# Patient Record
Sex: Female | Born: 1960
Health system: Southern US, Community
[De-identification: ages and names within clinical notes are randomized; demographics above are authoritative.]

## PROBLEM LIST (undated history)

## (undated) DIAGNOSIS — J45909 Unspecified asthma, uncomplicated: Secondary | ICD-10-CM

## (undated) DIAGNOSIS — I10 Essential (primary) hypertension: Secondary | ICD-10-CM

## (undated) DIAGNOSIS — R011 Cardiac murmur, unspecified: Secondary | ICD-10-CM

## (undated) DIAGNOSIS — M1990 Unspecified osteoarthritis, unspecified site: Secondary | ICD-10-CM

## (undated) DIAGNOSIS — R519 Headache, unspecified: Secondary | ICD-10-CM

## (undated) DIAGNOSIS — K219 Gastro-esophageal reflux disease without esophagitis: Secondary | ICD-10-CM

## (undated) DIAGNOSIS — J392 Other diseases of pharynx: Secondary | ICD-10-CM

## (undated) DIAGNOSIS — R200 Anesthesia of skin: Secondary | ICD-10-CM

## (undated) DIAGNOSIS — I671 Cerebral aneurysm, nonruptured: Secondary | ICD-10-CM

## (undated) DIAGNOSIS — Z885 Allergy status to narcotic agent status: Secondary | ICD-10-CM

## (undated) HISTORY — DX: Essential (primary) hypertension: I10

## (undated) HISTORY — PX: REPLACEMENT TOTAL KNEE BILATERAL: SUR1225

## (undated) HISTORY — DX: Cerebral aneurysm, nonruptured: I67.1

## (undated) HISTORY — PX: COLONOSCOPY: SHX174

## (undated) HISTORY — PX: APPENDECTOMY: SHX54

## (undated) HISTORY — PX: CHOLECYSTECTOMY: SHX55

## (undated) HISTORY — PX: JOINT REPLACEMENT: SHX530

---

## 1998-06-09 ENCOUNTER — Other Ambulatory Visit: Admission: RE | Admit: 1998-06-09 | Discharge: 1998-06-09 | Payer: Self-pay | Admitting: Gynecology

## 1998-07-03 ENCOUNTER — Observation Stay (HOSPITAL_COMMUNITY): Admission: RE | Admit: 1998-07-03 | Discharge: 1998-07-04 | Payer: Self-pay | Admitting: Gynecology

## 2005-06-25 ENCOUNTER — Other Ambulatory Visit: Admission: RE | Admit: 2005-06-25 | Discharge: 2005-06-25 | Payer: Self-pay | Admitting: Obstetrics and Gynecology

## 2005-10-18 ENCOUNTER — Ambulatory Visit (HOSPITAL_BASED_OUTPATIENT_CLINIC_OR_DEPARTMENT_OTHER): Admission: RE | Admit: 2005-10-18 | Discharge: 2005-10-18 | Payer: Self-pay | Admitting: Unknown Physician Specialty

## 2005-11-17 ENCOUNTER — Other Ambulatory Visit: Admission: RE | Admit: 2005-11-17 | Discharge: 2005-11-17 | Payer: Self-pay | Admitting: Obstetrics and Gynecology

## 2006-03-23 ENCOUNTER — Other Ambulatory Visit: Admission: RE | Admit: 2006-03-23 | Discharge: 2006-03-23 | Payer: Self-pay | Admitting: Internal Medicine

## 2006-07-20 ENCOUNTER — Other Ambulatory Visit: Admission: RE | Admit: 2006-07-20 | Discharge: 2006-07-20 | Payer: Self-pay | Admitting: Obstetrics and Gynecology

## 2006-11-30 ENCOUNTER — Other Ambulatory Visit: Admission: RE | Admit: 2006-11-30 | Discharge: 2006-11-30 | Payer: Self-pay | Admitting: Obstetrics and Gynecology

## 2010-10-30 NOTE — Op Note (Signed)
Christina Carter, Christina Carter                ACCOUNT NO.:  [id number]   MEDICAL RECORD NO.:  [id number]          PATIENT TYPE:  AMB   LOCATION:  [facility]                         FACILITY:  [facility]   PHYSICIAN:  Cynthia P. Romine, M.D.DATE OF BIRTH:  07-Nov-1960   DATE OF PROCEDURE:  10/18/2005  DATE OF DISCHARGE:                                 OPERATIVE REPORT   PREOPERATIVE DIAGNOSIS:  Menorrhagia.   POSTOPERATIVE DIAGNOSIS:  Menorrhagia.   PROCEDURE:  Endometrial ablation using the hydrothermablation technique.   SURGEON:  Cynthia P. Romine, M.D.   ANESTHESIA:  General by LMA.   ESTIMATED BLOOD LOSS:  Minimal.   COMPLICATIONS:  None.   PROCEDURE:  The patient was taken to the operating room and after induction  of adequate general anesthesia by LMA was placed in the dorsal lithotomy  position and prepped and draped in the usual fashion.  The bladder was  drained with the red rubber catheter.  The posterior weighted and anterior  Sims retractor were placed and the cervix was grasped at the anterior lip  with a single-toothed tenaculum.  The uterus sounded to 7 cm in the  midposition.  Cervix was dilated to a #25 Shawnie Pons.  The blading hysteroscope  was then introduced and the endometrial cavity visualized.  The tubal ostia  were clearly seen.  The scope was withdrawn to just inside the internal os  and hydrothermablation was carried out in the usual fashion.  There were no  complications.  Following completion of the ablation, photographic  documentation was taken of the cavity.  The scope was withdrawn and the  procedure was terminated.  The instruments were removed from the vagina.  The patient was taken to the recovery room in satisfactory condition.           ______________________________  Edwena Felty. Romine, M.D.     CPR/MEDQ  D:  10/18/2005  T:  10/19/2005  Job:  [id number]

## 2017-04-20 DIAGNOSIS — H524 Presbyopia: Secondary | ICD-10-CM | POA: Diagnosis not present

## 2017-05-12 ENCOUNTER — Emergency Department
Admission: EM | Admit: 2017-05-12 | Discharge: 2017-05-12 | Disposition: A | Discharge status: Other Acute Inpatient Hospital | Payer: Medicare HMO | Attending: Emergency Medicine | Admitting: Emergency Medicine

## 2017-05-12 ENCOUNTER — Encounter: Payer: Self-pay | Admitting: Emergency Medicine

## 2017-05-12 ENCOUNTER — Emergency Department: Payer: Medicare HMO

## 2017-05-12 ENCOUNTER — Other Ambulatory Visit: Payer: Self-pay

## 2017-05-12 DIAGNOSIS — I6203 Nontraumatic chronic subdural hemorrhage: Secondary | ICD-10-CM | POA: Diagnosis not present

## 2017-05-12 DIAGNOSIS — S065XAA Traumatic subdural hemorrhage with loss of consciousness status unknown, initial encounter: Secondary | ICD-10-CM

## 2017-05-12 DIAGNOSIS — G935 Compression of brain: Secondary | ICD-10-CM | POA: Diagnosis not present

## 2017-05-12 DIAGNOSIS — I6201 Nontraumatic acute subdural hemorrhage: Secondary | ICD-10-CM | POA: Insufficient documentation

## 2017-05-12 DIAGNOSIS — Y33XXXA Other specified events, undetermined intent, initial encounter: Secondary | ICD-10-CM | POA: Diagnosis not present

## 2017-05-12 DIAGNOSIS — I6200 Nontraumatic subdural hemorrhage, unspecified: Secondary | ICD-10-CM | POA: Diagnosis not present

## 2017-05-12 DIAGNOSIS — R51 Headache: Secondary | ICD-10-CM | POA: Diagnosis present

## 2017-05-12 DIAGNOSIS — R402363 Coma scale, best motor response, obeys commands, at hospital admission: Secondary | ICD-10-CM | POA: Diagnosis not present

## 2017-05-12 DIAGNOSIS — I10 Essential (primary) hypertension: Secondary | ICD-10-CM | POA: Diagnosis not present

## 2017-05-12 DIAGNOSIS — S065X9A Traumatic subdural hemorrhage with loss of consciousness of unspecified duration, initial encounter: Secondary | ICD-10-CM | POA: Diagnosis not present

## 2017-05-12 DIAGNOSIS — K219 Gastro-esophageal reflux disease without esophagitis: Secondary | ICD-10-CM | POA: Diagnosis not present

## 2017-05-12 DIAGNOSIS — R402253 Coma scale, best verbal response, oriented, at hospital admission: Secondary | ICD-10-CM | POA: Diagnosis not present

## 2017-05-12 DIAGNOSIS — J45909 Unspecified asthma, uncomplicated: Secondary | ICD-10-CM | POA: Diagnosis not present

## 2017-05-12 DIAGNOSIS — R402143 Coma scale, eyes open, spontaneous, at hospital admission: Secondary | ICD-10-CM | POA: Diagnosis not present

## 2017-05-12 HISTORY — DX: Unspecified osteoarthritis, unspecified site: M19.90

## 2017-05-12 LAB — COMPREHENSIVE METABOLIC PANEL
ALBUMIN: 3.4 g/dL — AB (ref 3.5–5.0)
ALT: 44 U/L (ref 14–54)
ANION GAP: 13 (ref 5–15)
AST: 55 U/L — AB (ref 15–41)
Alkaline Phosphatase: 62 U/L (ref 38–126)
BUN: 6 mg/dL (ref 6–20)
CHLORIDE: 95 mmol/L — AB (ref 101–111)
CO2: 27 mmol/L (ref 22–32)
Calcium: 8.9 mg/dL (ref 8.9–10.3)
Creatinine, Ser: 0.54 mg/dL (ref 0.44–1.00)
GFR calc Af Amer: >60 mL/min (ref >60)
GFR calc non Af Amer: >60 mL/min (ref >60)
GLUCOSE: 110 mg/dL — AB (ref 65–99)
POTASSIUM: 3.1 mmol/L — AB (ref 3.5–5.1)
Sodium: 135 mmol/L (ref 135–145)
Total Bilirubin: 1 mg/dL (ref 0.3–1.2)
Total Protein: 7.4 g/dL (ref 6.5–8.1)

## 2017-05-12 LAB — CBC
HCT: 43.5 % (ref 35.0–47.0)
Hemoglobin: 15.1 g/dL (ref 12.0–16.0)
MCH: 35.8 pg — ABNORMAL HIGH (ref 26.0–34.0)
MCHC: 34.8 g/dL (ref 32.0–36.0)
MCV: 102.9 fL — ABNORMAL HIGH (ref 80.0–100.0)
PLATELETS: 286 10*3/uL (ref 150–440)
RBC: 4.23 MIL/uL (ref 3.80–5.20)
RDW: 13.2 % (ref 11.5–14.5)
WBC: 9.2 10*3/uL (ref 3.6–11.0)

## 2017-05-12 LAB — PROTIME-INR
INR: 0.9
PROTHROMBIN TIME: 12.1 s (ref 11.4–15.2)

## 2017-05-12 MED ORDER — KETOROLAC TROMETHAMINE 30 MG/ML IJ SOLN
30.0000 mg | Freq: Once | INTRAMUSCULAR | Status: AC
  Administered 2017-05-12: 30 mg via INTRAVENOUS
  Filled 2017-05-12: qty 1

## 2017-05-12 NOTE — ED Notes (Signed)
Epifanio Lesches at [facility] notified that pt was on the way with estimated eta 20-25 minutes.

## 2017-05-12 NOTE — ED Notes (Signed)
Husband took all pt belongings plus cane.

## 2017-05-12 NOTE — ED Triage Notes (Signed)
Patient ambulatory to triage with aid of cane, without difficulty or distress noted; pt reports right sided HA several days accomp by nausea; denies hx of same

## 2017-05-12 NOTE — ED Provider Notes (Signed)
[facility] Emergency Department Provider Note    First MD Initiated Contact with Patient 05/12/17 [phone]     (approximate)  I have reviewed the triage vital signs and the nursing notes.   HISTORY  Chief Complaint Headache    HPI Christina Carter is a 56 y.o. female density emergency department 4-day history of right occipital headache radiating to the frontal region with current pain score of 9 out of 10.  Patient also admits to altered vision when she looks from side to side.  Patient denies any weakness no numbness gait instability.  Patient denies any nausea or vomiting.  Patient states that she thought it was secondary to poor vision and as such she saw her eye doctor who informed her that her vision not the problem.  Patient denies any personal family history of aneurysms.   Past Medical History:  Diagnosis Date  . Osteoarthritis     There are no active problems to display for this patient.   History reviewed. No pertinent surgical history.  Prior to Admission medications   Not on File    Allergies Codeine  No family history on file.  Social History Social History   Tobacco Use  . Smoking status: Not on file  Substance Use Topics  . Alcohol use: Not on file  . Drug use: Not on file    Review of Systems Constitutional: No fever/chills Eyes: No visual changes. ENT: No sore throat. Cardiovascular: Denies chest pain. Respiratory: Denies shortness of breath. Gastrointestinal: No abdominal pain.  No nausea, no vomiting.  No diarrhea.  No constipation. Genitourinary: Negative for dysuria. Musculoskeletal: Negative for neck pain.  Negative for back pain. Integumentary: Negative for rash. Neurological: Positive for headaches, negative focal weakness or numbness.  ____________________________________________   PHYSICAL EXAM:  VITAL SIGNS: ED Triage Vitals  Enc Vitals Group     BP 05/12/17 0612 (!) 149/73     Pulse Rate 05/12/17  0612 92     Resp 05/12/17 0612 20     Temp 05/12/17 0612 97.9 F (36.6 C)     Temp Source 05/12/17 0612 Oral     SpO2 05/12/17 0612 96 %     Weight 05/12/17 0606 115.7 kg (255 lb)     Height 05/12/17 0606 1.626 m (5\' 4" )     Head Circumference --      Peak Flow --      Pain Score 05/12/17 0605 9     Pain Loc --      Pain Edu? --      Excl. in [place]? --     Constitutional: Alert and oriented. Well appearing and in no acute distress. Eyes: Conjunctivae are normal. PERRL. EOMI. Head: Atraumatic. Mouth/Throat: Mucous membranes are moist.  Oropharynx non-erythematous. Neck: No stridor.  No meningeal signs. Cardiovascular: Normal rate, regular rhythm. Good peripheral circulation. Grossly normal heart sounds. Respiratory: Normal respiratory effort.  No retractions. Lungs CTAB. Gastrointestinal: Soft and nontender. No distention.  Musculoskeletal: No lower extremity tenderness nor edema. No gross deformities of extremities. Neurologic:  Normal speech and language. No gross focal neurologic deficits are appreciated.  Skin:  Skin is warm, dry and intact. No rash noted. Psychiatric: Mood and affect are normal. Speech and behavior are normal.  ____________________________________________   LABS (all labs ordered are listed, but only abnormal results are displayed)  Labs Reviewed  CBC - Abnormal; Notable for the following components:      Result Value   MCV 102.9 (*)  MCH 35.8 (*)    All other components within normal limits  COMPREHENSIVE METABOLIC PANEL - Abnormal; Notable for the following components:   Potassium 3.1 (*)    Chloride 95 (*)    Glucose, Bld 110 (*)    Albumin 3.4 (*)    AST 55 (*)    All other components within normal limits  PROTIME-INR   ____________________________________________  EKG  ED ECG REPORT I, [place] N Naftali Carchi, the attending physician, personally viewed and interpreted this ECG.   Date: 05/12/2017  EKG Time: 6:49 AM  Rate: 80  Rhythm: Normal  sinus rhythm  Axis: Normal  Intervals: Normal  ST&T Change: None  ____________________________________________  RADIOLOGY I, [place] N Keiffer Piper, personally viewed and evaluated these images (plain radiographs) as part of my medical decision making, as well as reviewing the written report by the radiologist.  Ct Head Wo Contrast  Result Date: 05/12/2017 CLINICAL DATA:  Acute right-sided headache.  Nausea. EXAM: CT HEAD WITHOUT CONTRAST TECHNIQUE: Contiguous axial images were obtained from the base of the skull through the vertex without intravenous contrast. COMPARISON:  None. FINDINGS: Brain: Large subdural fluid collection measures up to 2 cm about the frontal convexity. There both acute and subacute/chronic components. Acute blood is seen anterior and posteriorly. Minimal blood tracks along the tentorium, and falx anteriorly. There is 12 mm right to left midline shift. Diminished size of the posterior horn of the right lateral ventricle. No hydrocephalus, the basilar cisterns are patent. No evidence of acute ischemia. Vascular: No hyperdense vessel or unexpected calcification. Skull: No fracture or focal lesion. Sinuses/Orbits: Paranasal sinuses and mastoid air cells are clear. The visualized orbits are unremarkable. Other: None. IMPRESSION: Large right subdural fluid collection with both acute and subacute components. There is 12 mm right to left midline shift. Critical Value/emergent results were called by telephone at the time of interpretation on 05/12/2017 at 6:44 am to Dr. Marjean Donna , who verbally acknowledged these results. Electronically Signed   By: Jeb Levering M.D.   On: 05/12/2017 06:45    ____________________________________________   PROCEDURES  Critical Care performed: CRITICAL CARE Performed by: Gregor Hams   Total critical care time: 40 minutes  Critical care time was exclusive of separately billable procedures and treating other patients.  Critical care  was necessary to treat or prevent imminent or life-threatening deterioration.  Critical care was time spent personally by me on the following activities: development of treatment plan with patient and/or surrogate as well as nursing, discussions with consultants, evaluation of patient's response to treatment, examination of patient, obtaining history from patient or surrogate, ordering and performing treatments and interventions, ordering and review of laboratory studies, ordering and review of radiographic studies, pulse oximetry and re-evaluation of patient's condition.  Procedures   ____________________________________________   INITIAL IMPRESSION / ASSESSMENT AND PLAN / ED COURSE  As part of my medical decision making, I reviewed the following data within the electronic MEDICAL RECORD [id number] year old female presented with above-stated history of.  Given concern for possible intracranial abnormality CT scan of the head was performed which revealed a large right frontal parietal subdural with approximate 12 mm of midline shift.  Patient discussed with Dr. Izora Ribas neurosurgeon on call who accepted the patient in transfer to [facility].  ____________________________________________  FINAL CLINICAL IMPRESSION(S) / ED DIAGNOSES  Final diagnoses:  Subdural hematoma ([place])     MEDICATIONS GIVEN DURING THIS VISIT:  Medications  ketorolac (TORADOL) 30 MG/ML injection 30 mg (30 mg Intravenous Given  05/12/17 1610)     ED Discharge Orders    None       Note:  This document was prepared using Dragon voice recognition software and may include unintentional dictation errors.    Gregor Hams, MD 05/12/17 [phone]

## 2017-05-14 DIAGNOSIS — I671 Cerebral aneurysm, nonruptured: Secondary | ICD-10-CM

## 2017-05-14 HISTORY — DX: Cerebral aneurysm, nonruptured: I67.1

## 2017-05-15 DIAGNOSIS — Z9181 History of falling: Secondary | ICD-10-CM | POA: Diagnosis not present

## 2017-05-15 DIAGNOSIS — Z6841 Body Mass Index (BMI) 40.0 and over, adult: Secondary | ICD-10-CM | POA: Diagnosis not present

## 2017-05-15 DIAGNOSIS — S065X9D Traumatic subdural hemorrhage with loss of consciousness of unspecified duration, subsequent encounter: Secondary | ICD-10-CM | POA: Diagnosis not present

## 2017-05-15 DIAGNOSIS — J45909 Unspecified asthma, uncomplicated: Secondary | ICD-10-CM | POA: Diagnosis not present

## 2017-05-15 DIAGNOSIS — I10 Essential (primary) hypertension: Secondary | ICD-10-CM | POA: Diagnosis not present

## 2017-05-26 DIAGNOSIS — Z4802 Encounter for removal of sutures: Secondary | ICD-10-CM | POA: Diagnosis not present

## 2017-06-17 DIAGNOSIS — J45909 Unspecified asthma, uncomplicated: Secondary | ICD-10-CM | POA: Diagnosis not present

## 2017-06-17 DIAGNOSIS — Z1331 Encounter for screening for depression: Secondary | ICD-10-CM | POA: Diagnosis not present

## 2017-06-17 DIAGNOSIS — Z6841 Body Mass Index (BMI) 40.0 and over, adult: Secondary | ICD-10-CM | POA: Diagnosis not present

## 2017-06-17 DIAGNOSIS — I10 Essential (primary) hypertension: Secondary | ICD-10-CM | POA: Diagnosis not present

## 2017-06-17 DIAGNOSIS — E7800 Pure hypercholesterolemia, unspecified: Secondary | ICD-10-CM | POA: Diagnosis not present

## 2018-03-16 DIAGNOSIS — I10 Essential (primary) hypertension: Secondary | ICD-10-CM | POA: Diagnosis not present

## 2018-03-16 DIAGNOSIS — E7800 Pure hypercholesterolemia, unspecified: Secondary | ICD-10-CM | POA: Diagnosis not present

## 2018-05-03 DIAGNOSIS — Z6841 Body Mass Index (BMI) 40.0 and over, adult: Secondary | ICD-10-CM | POA: Diagnosis not present

## 2018-05-03 DIAGNOSIS — Z1339 Encounter for screening examination for other mental health and behavioral disorders: Secondary | ICD-10-CM | POA: Diagnosis not present

## 2018-05-03 DIAGNOSIS — E7800 Pure hypercholesterolemia, unspecified: Secondary | ICD-10-CM | POA: Diagnosis not present

## 2018-05-03 DIAGNOSIS — I10 Essential (primary) hypertension: Secondary | ICD-10-CM | POA: Diagnosis not present

## 2018-05-03 DIAGNOSIS — M170 Bilateral primary osteoarthritis of knee: Secondary | ICD-10-CM | POA: Diagnosis not present

## 2018-05-03 DIAGNOSIS — J45909 Unspecified asthma, uncomplicated: Secondary | ICD-10-CM | POA: Diagnosis not present

## 2018-05-03 DIAGNOSIS — G4700 Insomnia, unspecified: Secondary | ICD-10-CM | POA: Diagnosis not present

## 2018-07-26 DIAGNOSIS — B001 Herpesviral vesicular dermatitis: Secondary | ICD-10-CM | POA: Diagnosis not present

## 2018-07-26 DIAGNOSIS — Z6841 Body Mass Index (BMI) 40.0 and over, adult: Secondary | ICD-10-CM | POA: Diagnosis not present

## 2018-07-26 DIAGNOSIS — K120 Recurrent oral aphthae: Secondary | ICD-10-CM | POA: Diagnosis not present

## 2018-11-02 DIAGNOSIS — J45909 Unspecified asthma, uncomplicated: Secondary | ICD-10-CM | POA: Diagnosis not present

## 2018-11-02 DIAGNOSIS — I10 Essential (primary) hypertension: Secondary | ICD-10-CM | POA: Diagnosis not present

## 2018-11-02 DIAGNOSIS — R5383 Other fatigue: Secondary | ICD-10-CM | POA: Diagnosis not present

## 2018-11-02 DIAGNOSIS — E7800 Pure hypercholesterolemia, unspecified: Secondary | ICD-10-CM | POA: Diagnosis not present

## 2018-11-02 DIAGNOSIS — M170 Bilateral primary osteoarthritis of knee: Secondary | ICD-10-CM | POA: Diagnosis not present

## 2018-11-02 DIAGNOSIS — K219 Gastro-esophageal reflux disease without esophagitis: Secondary | ICD-10-CM | POA: Diagnosis not present

## 2018-12-08 ENCOUNTER — Ambulatory Visit
Admission: RE | Admit: 2018-12-08 | Discharge: 2018-12-08 | Disposition: A | Discharge status: Home / Self Care | Payer: Disability Insurance | Source: Ambulatory Visit | Attending: Dentistry | Admitting: Dentistry

## 2018-12-08 ENCOUNTER — Other Ambulatory Visit: Payer: Self-pay | Admitting: Dentistry

## 2018-12-08 DIAGNOSIS — M1712 Unilateral primary osteoarthritis, left knee: Secondary | ICD-10-CM

## 2018-12-08 DIAGNOSIS — M1711 Unilateral primary osteoarthritis, right knee: Secondary | ICD-10-CM | POA: Diagnosis present

## 2019-01-02 DIAGNOSIS — I10 Essential (primary) hypertension: Secondary | ICD-10-CM | POA: Diagnosis not present

## 2019-01-02 DIAGNOSIS — E7800 Pure hypercholesterolemia, unspecified: Secondary | ICD-10-CM | POA: Diagnosis not present

## 2019-01-20 IMAGING — CT CT HEAD W/O CM
3 series · 15 of 46 positions shown, 18 images · non-contrast
Comparison: None.

CLINICAL DATA: Acute right-sided headache.  Nausea.

EXAM:
CT HEAD WITHOUT CONTRAST
TECHNIQUE: Contiguous axial images were obtained from the base of the skull
through the vertex without intravenous contrast.

[Series 3: head wo · axial · 0.40mm/px · z∈[-115,+5]mm · 9 of 29 slices shown, 12 images]
[im 3/29  brain]
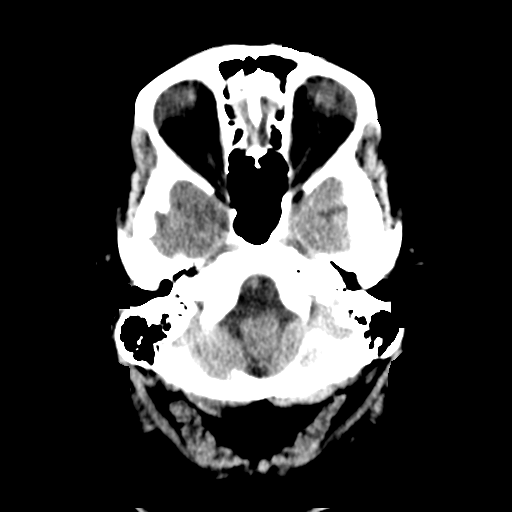
[im 3/29  bone]
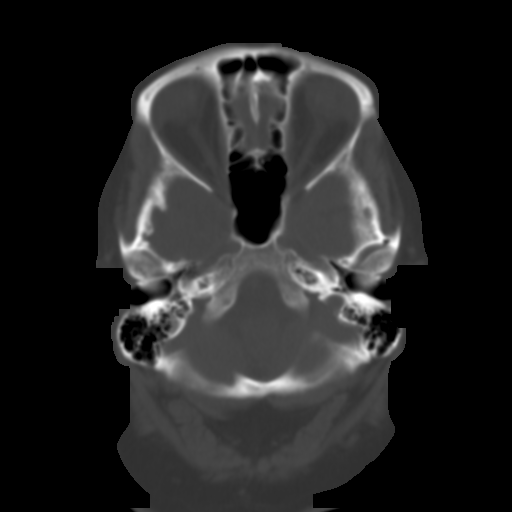
[im 6/29  brain]
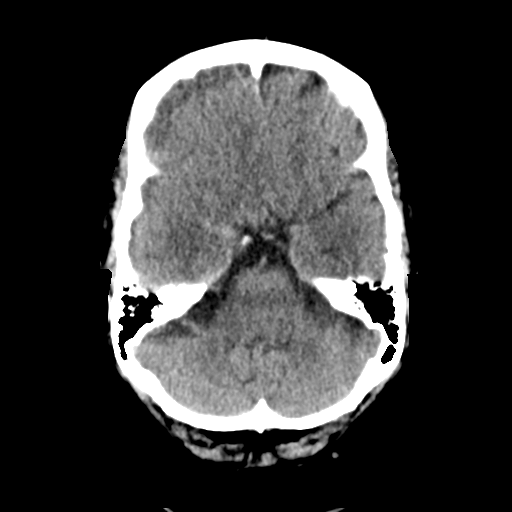
[im 9/29  brain]
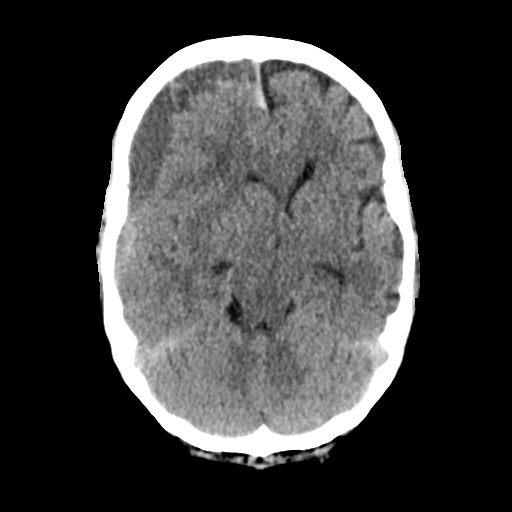
[im 12/29  brain]
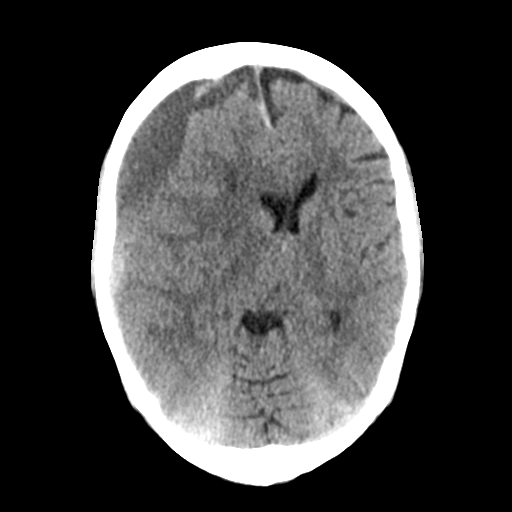
[im 15/29  brain]
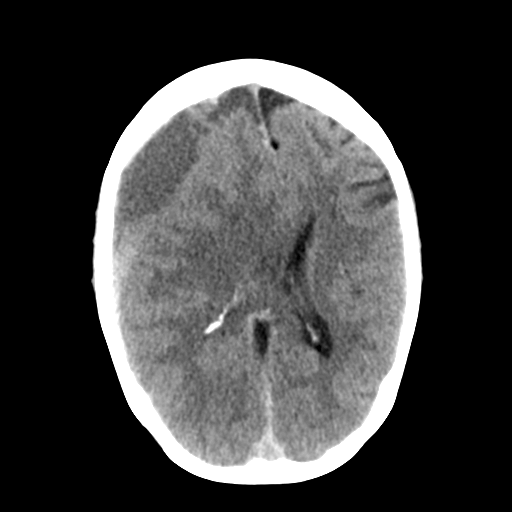
[im 15/29  bone]
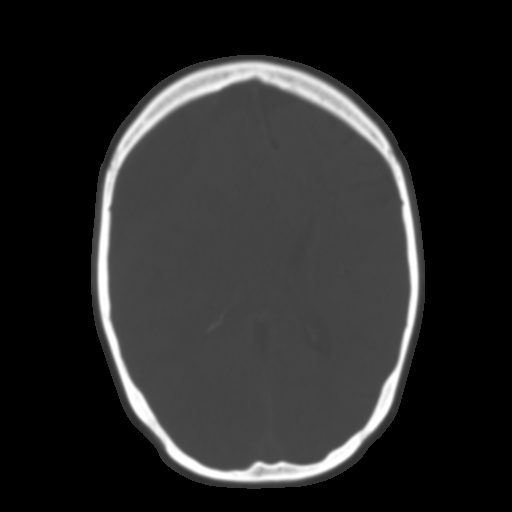
[im 18/29  brain]
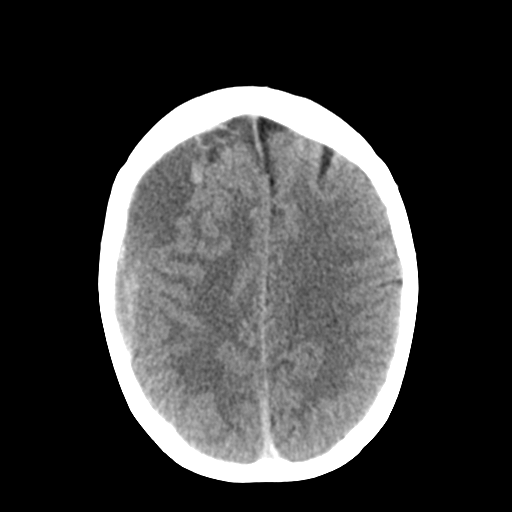
[im 21/29  brain]
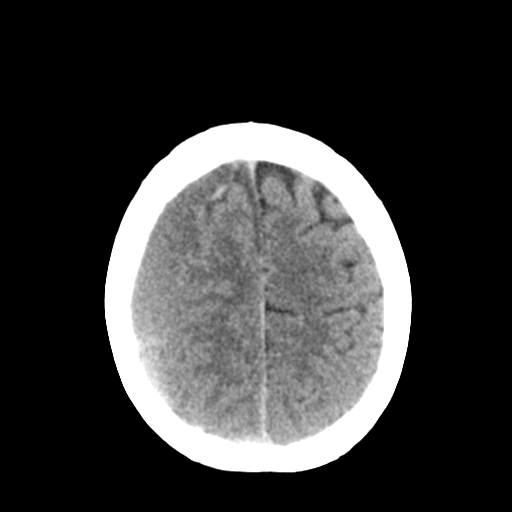
[im 24/29  brain]
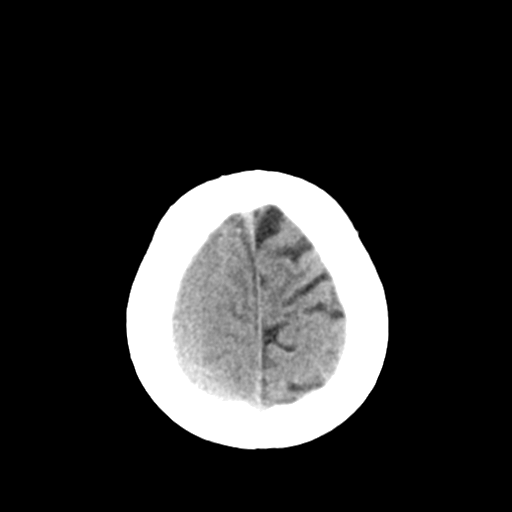
[im 27/29  brain]
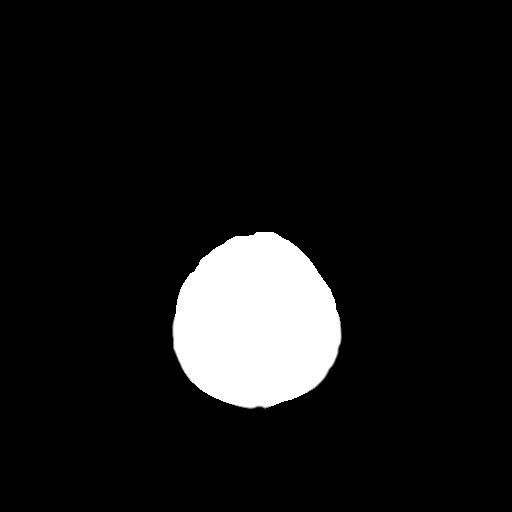
[im 27/29  bone]
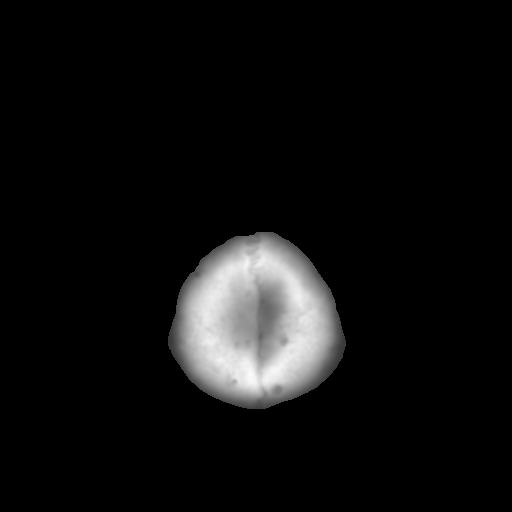

[Series 4: coronal soft tissue · coronal · 0.29mm/px · 3 of 62 slices shown]
[im 21/62  brain]
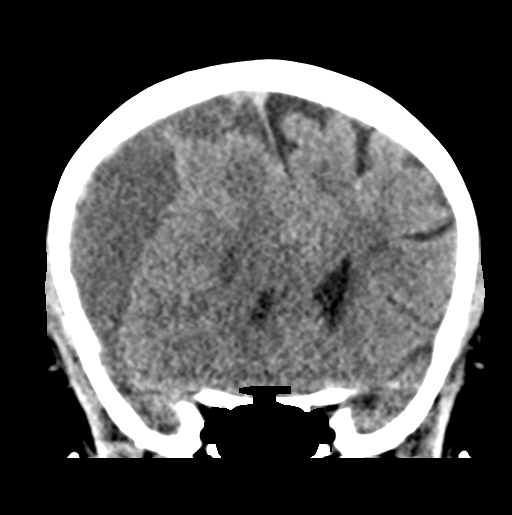
[im 28/62  brain]
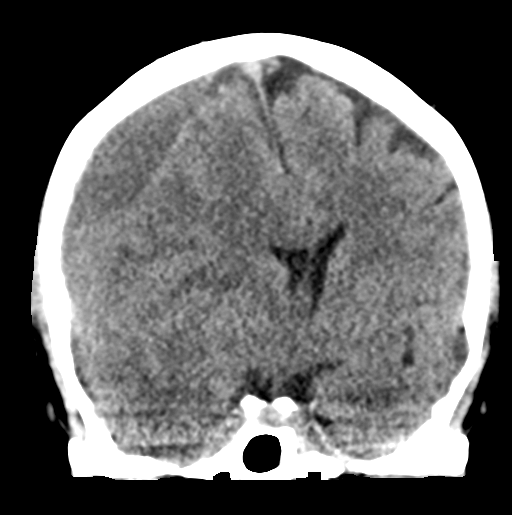
[im 34/62  brain]
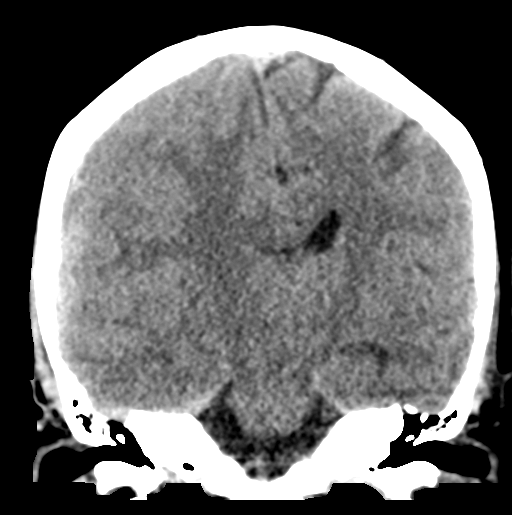

[Series 5: sagittal soft tissue · sagittal · 0.29mm/px · 3 of 47 slices shown]
[im 16/47  brain]
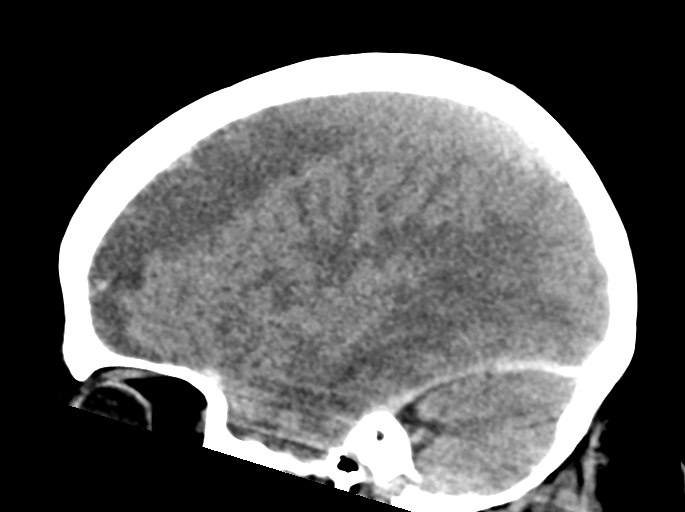
[im 24/47  brain]
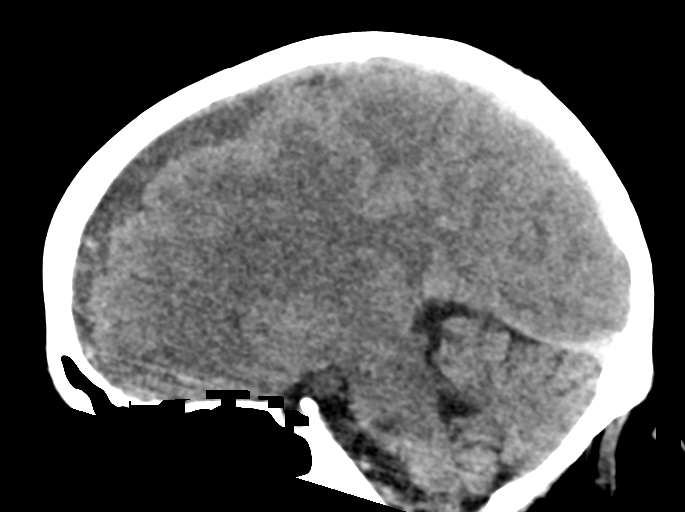
[im 31/47  brain]
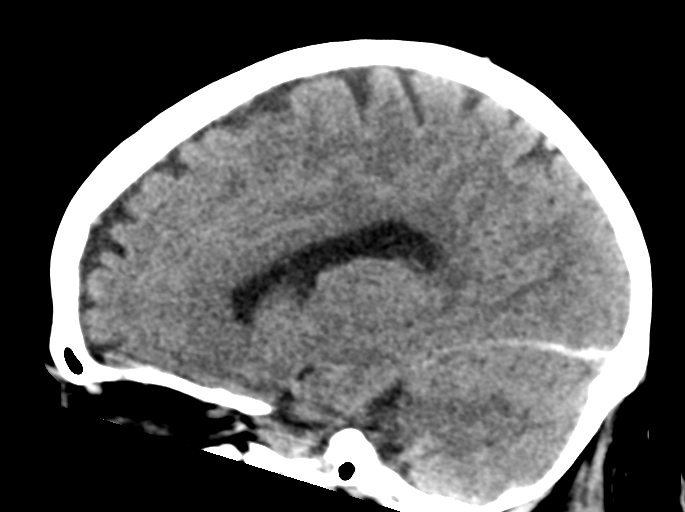

[15 of 46 positions shown; findings below may reference images not displayed]

FINDINGS: Brain: Large subdural fluid collection measures up to 2 cm about the
frontal convexity. There both acute and subacute/chronic components.
Acute blood is seen anterior and posteriorly. Minimal blood tracks
along the tentorium, and falx anteriorly. There is 12 mm right to
left midline shift. Diminished size of the posterior horn of the
right lateral ventricle. No hydrocephalus, the basilar cisterns are
patent. No evidence of acute ischemia.

Vascular: No hyperdense vessel or unexpected calcification.

Skull: No fracture or focal lesion.

Sinuses/Orbits: Paranasal sinuses and mastoid air cells are clear.
The visualized orbits are unremarkable.

Other: None.
IMPRESSION: Large right subdural fluid collection with both acute and subacute
components. There is 12 mm right to left midline shift.

Critical Value/emergent results were called by telephone at the time
of interpretation on 05/12/2017 at [DATE] to Dr. REYMUNDO DARIUS ,
who verbally acknowledged these results.

## 2019-05-08 DIAGNOSIS — E7800 Pure hypercholesterolemia, unspecified: Secondary | ICD-10-CM | POA: Diagnosis not present

## 2019-05-08 DIAGNOSIS — Z79899 Other long term (current) drug therapy: Secondary | ICD-10-CM | POA: Diagnosis not present

## 2019-05-08 DIAGNOSIS — M170 Bilateral primary osteoarthritis of knee: Secondary | ICD-10-CM | POA: Diagnosis not present

## 2019-05-08 DIAGNOSIS — Z1331 Encounter for screening for depression: Secondary | ICD-10-CM | POA: Diagnosis not present

## 2019-05-08 DIAGNOSIS — J45909 Unspecified asthma, uncomplicated: Secondary | ICD-10-CM | POA: Diagnosis not present

## 2019-05-08 DIAGNOSIS — I10 Essential (primary) hypertension: Secondary | ICD-10-CM | POA: Diagnosis not present

## 2019-05-08 DIAGNOSIS — Z6841 Body Mass Index (BMI) 40.0 and over, adult: Secondary | ICD-10-CM | POA: Diagnosis not present

## 2019-05-17 DIAGNOSIS — M170 Bilateral primary osteoarthritis of knee: Secondary | ICD-10-CM | POA: Diagnosis not present

## 2019-05-24 DIAGNOSIS — M1711 Unilateral primary osteoarthritis, right knee: Secondary | ICD-10-CM | POA: Diagnosis not present

## 2019-05-30 DIAGNOSIS — I10 Essential (primary) hypertension: Secondary | ICD-10-CM | POA: Diagnosis not present

## 2019-05-30 DIAGNOSIS — M1711 Unilateral primary osteoarthritis, right knee: Secondary | ICD-10-CM | POA: Diagnosis not present

## 2019-05-30 DIAGNOSIS — Z9181 History of falling: Secondary | ICD-10-CM | POA: Diagnosis not present

## 2019-05-30 DIAGNOSIS — J45909 Unspecified asthma, uncomplicated: Secondary | ICD-10-CM | POA: Diagnosis not present

## 2019-05-30 DIAGNOSIS — Z87891 Personal history of nicotine dependence: Secondary | ICD-10-CM | POA: Diagnosis not present

## 2019-05-30 DIAGNOSIS — Z96652 Presence of left artificial knee joint: Secondary | ICD-10-CM | POA: Diagnosis not present

## 2019-05-30 DIAGNOSIS — K219 Gastro-esophageal reflux disease without esophagitis: Secondary | ICD-10-CM | POA: Diagnosis not present

## 2019-06-02 DIAGNOSIS — Z87891 Personal history of nicotine dependence: Secondary | ICD-10-CM | POA: Diagnosis not present

## 2019-06-02 DIAGNOSIS — K219 Gastro-esophageal reflux disease without esophagitis: Secondary | ICD-10-CM | POA: Diagnosis not present

## 2019-06-02 DIAGNOSIS — Z96652 Presence of left artificial knee joint: Secondary | ICD-10-CM | POA: Diagnosis not present

## 2019-06-02 DIAGNOSIS — I10 Essential (primary) hypertension: Secondary | ICD-10-CM | POA: Diagnosis not present

## 2019-06-02 DIAGNOSIS — M1711 Unilateral primary osteoarthritis, right knee: Secondary | ICD-10-CM | POA: Diagnosis not present

## 2019-06-02 DIAGNOSIS — J45909 Unspecified asthma, uncomplicated: Secondary | ICD-10-CM | POA: Diagnosis not present

## 2019-06-02 DIAGNOSIS — Z9181 History of falling: Secondary | ICD-10-CM | POA: Diagnosis not present

## 2019-06-04 DIAGNOSIS — J45909 Unspecified asthma, uncomplicated: Secondary | ICD-10-CM | POA: Diagnosis not present

## 2019-06-04 DIAGNOSIS — M1711 Unilateral primary osteoarthritis, right knee: Secondary | ICD-10-CM | POA: Diagnosis not present

## 2019-06-04 DIAGNOSIS — Z9181 History of falling: Secondary | ICD-10-CM | POA: Diagnosis not present

## 2019-06-04 DIAGNOSIS — K219 Gastro-esophageal reflux disease without esophagitis: Secondary | ICD-10-CM | POA: Diagnosis not present

## 2019-06-04 DIAGNOSIS — Z96652 Presence of left artificial knee joint: Secondary | ICD-10-CM | POA: Diagnosis not present

## 2019-06-04 DIAGNOSIS — Z87891 Personal history of nicotine dependence: Secondary | ICD-10-CM | POA: Diagnosis not present

## 2019-06-04 DIAGNOSIS — I10 Essential (primary) hypertension: Secondary | ICD-10-CM | POA: Diagnosis not present

## 2019-06-09 DIAGNOSIS — K219 Gastro-esophageal reflux disease without esophagitis: Secondary | ICD-10-CM | POA: Diagnosis not present

## 2019-06-09 DIAGNOSIS — J45909 Unspecified asthma, uncomplicated: Secondary | ICD-10-CM | POA: Diagnosis not present

## 2019-06-09 DIAGNOSIS — I10 Essential (primary) hypertension: Secondary | ICD-10-CM | POA: Diagnosis not present

## 2019-06-09 DIAGNOSIS — Z9181 History of falling: Secondary | ICD-10-CM | POA: Diagnosis not present

## 2019-06-09 DIAGNOSIS — M1711 Unilateral primary osteoarthritis, right knee: Secondary | ICD-10-CM | POA: Diagnosis not present

## 2019-06-09 DIAGNOSIS — Z87891 Personal history of nicotine dependence: Secondary | ICD-10-CM | POA: Diagnosis not present

## 2019-06-09 DIAGNOSIS — Z96652 Presence of left artificial knee joint: Secondary | ICD-10-CM | POA: Diagnosis not present

## 2019-06-12 DIAGNOSIS — Z96652 Presence of left artificial knee joint: Secondary | ICD-10-CM | POA: Diagnosis not present

## 2019-06-12 DIAGNOSIS — I10 Essential (primary) hypertension: Secondary | ICD-10-CM | POA: Diagnosis not present

## 2019-06-12 DIAGNOSIS — Z9181 History of falling: Secondary | ICD-10-CM | POA: Diagnosis not present

## 2019-06-12 DIAGNOSIS — K219 Gastro-esophageal reflux disease without esophagitis: Secondary | ICD-10-CM | POA: Diagnosis not present

## 2019-06-12 DIAGNOSIS — M1711 Unilateral primary osteoarthritis, right knee: Secondary | ICD-10-CM | POA: Diagnosis not present

## 2019-06-12 DIAGNOSIS — Z87891 Personal history of nicotine dependence: Secondary | ICD-10-CM | POA: Diagnosis not present

## 2019-06-12 DIAGNOSIS — J45909 Unspecified asthma, uncomplicated: Secondary | ICD-10-CM | POA: Diagnosis not present

## 2019-06-14 DIAGNOSIS — Z87891 Personal history of nicotine dependence: Secondary | ICD-10-CM | POA: Diagnosis not present

## 2019-06-14 DIAGNOSIS — M1711 Unilateral primary osteoarthritis, right knee: Secondary | ICD-10-CM | POA: Diagnosis not present

## 2019-06-14 DIAGNOSIS — K219 Gastro-esophageal reflux disease without esophagitis: Secondary | ICD-10-CM | POA: Diagnosis not present

## 2019-06-14 DIAGNOSIS — Z9181 History of falling: Secondary | ICD-10-CM | POA: Diagnosis not present

## 2019-06-14 DIAGNOSIS — J45909 Unspecified asthma, uncomplicated: Secondary | ICD-10-CM | POA: Diagnosis not present

## 2019-06-14 DIAGNOSIS — I10 Essential (primary) hypertension: Secondary | ICD-10-CM | POA: Diagnosis not present

## 2019-06-14 DIAGNOSIS — Z96652 Presence of left artificial knee joint: Secondary | ICD-10-CM | POA: Diagnosis not present

## 2019-06-18 DIAGNOSIS — Z87891 Personal history of nicotine dependence: Secondary | ICD-10-CM | POA: Diagnosis not present

## 2019-06-18 DIAGNOSIS — Z9181 History of falling: Secondary | ICD-10-CM | POA: Diagnosis not present

## 2019-06-18 DIAGNOSIS — I10 Essential (primary) hypertension: Secondary | ICD-10-CM | POA: Diagnosis not present

## 2019-06-18 DIAGNOSIS — K219 Gastro-esophageal reflux disease without esophagitis: Secondary | ICD-10-CM | POA: Diagnosis not present

## 2019-06-18 DIAGNOSIS — Z96652 Presence of left artificial knee joint: Secondary | ICD-10-CM | POA: Diagnosis not present

## 2019-06-18 DIAGNOSIS — M1711 Unilateral primary osteoarthritis, right knee: Secondary | ICD-10-CM | POA: Diagnosis not present

## 2019-06-18 DIAGNOSIS — J45909 Unspecified asthma, uncomplicated: Secondary | ICD-10-CM | POA: Diagnosis not present

## 2019-06-20 DIAGNOSIS — J45909 Unspecified asthma, uncomplicated: Secondary | ICD-10-CM | POA: Diagnosis not present

## 2019-06-20 DIAGNOSIS — Z87891 Personal history of nicotine dependence: Secondary | ICD-10-CM | POA: Diagnosis not present

## 2019-06-20 DIAGNOSIS — Z01818 Encounter for other preprocedural examination: Secondary | ICD-10-CM | POA: Diagnosis not present

## 2019-06-20 DIAGNOSIS — K219 Gastro-esophageal reflux disease without esophagitis: Secondary | ICD-10-CM | POA: Diagnosis not present

## 2019-06-20 DIAGNOSIS — Z96652 Presence of left artificial knee joint: Secondary | ICD-10-CM | POA: Diagnosis not present

## 2019-06-20 DIAGNOSIS — M170 Bilateral primary osteoarthritis of knee: Secondary | ICD-10-CM | POA: Diagnosis not present

## 2019-06-20 DIAGNOSIS — Z6841 Body Mass Index (BMI) 40.0 and over, adult: Secondary | ICD-10-CM | POA: Diagnosis not present

## 2019-06-20 DIAGNOSIS — Z9181 History of falling: Secondary | ICD-10-CM | POA: Diagnosis not present

## 2019-06-20 DIAGNOSIS — I10 Essential (primary) hypertension: Secondary | ICD-10-CM | POA: Diagnosis not present

## 2019-06-20 DIAGNOSIS — M1711 Unilateral primary osteoarthritis, right knee: Secondary | ICD-10-CM | POA: Diagnosis not present

## 2019-06-26 DIAGNOSIS — I10 Essential (primary) hypertension: Secondary | ICD-10-CM | POA: Diagnosis not present

## 2019-06-26 DIAGNOSIS — K219 Gastro-esophageal reflux disease without esophagitis: Secondary | ICD-10-CM | POA: Diagnosis not present

## 2019-06-26 DIAGNOSIS — Z96652 Presence of left artificial knee joint: Secondary | ICD-10-CM | POA: Diagnosis not present

## 2019-06-26 DIAGNOSIS — Z87891 Personal history of nicotine dependence: Secondary | ICD-10-CM | POA: Diagnosis not present

## 2019-06-26 DIAGNOSIS — M1711 Unilateral primary osteoarthritis, right knee: Secondary | ICD-10-CM | POA: Diagnosis not present

## 2019-06-26 DIAGNOSIS — J45909 Unspecified asthma, uncomplicated: Secondary | ICD-10-CM | POA: Diagnosis not present

## 2019-06-26 DIAGNOSIS — Z9181 History of falling: Secondary | ICD-10-CM | POA: Diagnosis not present

## 2019-06-27 DIAGNOSIS — M6281 Muscle weakness (generalized): Secondary | ICD-10-CM | POA: Diagnosis not present

## 2019-06-28 DIAGNOSIS — Z96652 Presence of left artificial knee joint: Secondary | ICD-10-CM | POA: Diagnosis not present

## 2019-06-28 DIAGNOSIS — J45909 Unspecified asthma, uncomplicated: Secondary | ICD-10-CM | POA: Diagnosis not present

## 2019-06-28 DIAGNOSIS — Z87891 Personal history of nicotine dependence: Secondary | ICD-10-CM | POA: Diagnosis not present

## 2019-06-28 DIAGNOSIS — M1711 Unilateral primary osteoarthritis, right knee: Secondary | ICD-10-CM | POA: Diagnosis not present

## 2019-06-28 DIAGNOSIS — Z9181 History of falling: Secondary | ICD-10-CM | POA: Diagnosis not present

## 2019-06-28 DIAGNOSIS — K219 Gastro-esophageal reflux disease without esophagitis: Secondary | ICD-10-CM | POA: Diagnosis not present

## 2019-06-28 DIAGNOSIS — I10 Essential (primary) hypertension: Secondary | ICD-10-CM | POA: Diagnosis not present

## 2019-07-03 DIAGNOSIS — M1711 Unilateral primary osteoarthritis, right knee: Secondary | ICD-10-CM | POA: Diagnosis not present

## 2019-07-03 DIAGNOSIS — K219 Gastro-esophageal reflux disease without esophagitis: Secondary | ICD-10-CM | POA: Diagnosis not present

## 2019-07-03 DIAGNOSIS — Z87891 Personal history of nicotine dependence: Secondary | ICD-10-CM | POA: Diagnosis not present

## 2019-07-03 DIAGNOSIS — M6281 Muscle weakness (generalized): Secondary | ICD-10-CM | POA: Diagnosis not present

## 2019-07-03 DIAGNOSIS — M25561 Pain in right knee: Secondary | ICD-10-CM | POA: Diagnosis not present

## 2019-07-03 DIAGNOSIS — I10 Essential (primary) hypertension: Secondary | ICD-10-CM | POA: Diagnosis not present

## 2019-07-03 DIAGNOSIS — M25661 Stiffness of right knee, not elsewhere classified: Secondary | ICD-10-CM | POA: Diagnosis not present

## 2019-07-03 DIAGNOSIS — Z96652 Presence of left artificial knee joint: Secondary | ICD-10-CM | POA: Diagnosis not present

## 2019-07-03 DIAGNOSIS — Z9181 History of falling: Secondary | ICD-10-CM | POA: Diagnosis not present

## 2019-07-03 DIAGNOSIS — J45909 Unspecified asthma, uncomplicated: Secondary | ICD-10-CM | POA: Diagnosis not present

## 2019-07-05 DIAGNOSIS — K219 Gastro-esophageal reflux disease without esophagitis: Secondary | ICD-10-CM | POA: Diagnosis not present

## 2019-07-05 DIAGNOSIS — I10 Essential (primary) hypertension: Secondary | ICD-10-CM | POA: Diagnosis not present

## 2019-07-05 DIAGNOSIS — J45909 Unspecified asthma, uncomplicated: Secondary | ICD-10-CM | POA: Diagnosis not present

## 2019-07-05 DIAGNOSIS — Z9181 History of falling: Secondary | ICD-10-CM | POA: Diagnosis not present

## 2019-07-05 DIAGNOSIS — Z96652 Presence of left artificial knee joint: Secondary | ICD-10-CM | POA: Diagnosis not present

## 2019-07-05 DIAGNOSIS — M1711 Unilateral primary osteoarthritis, right knee: Secondary | ICD-10-CM | POA: Diagnosis not present

## 2019-07-05 DIAGNOSIS — Z87891 Personal history of nicotine dependence: Secondary | ICD-10-CM | POA: Diagnosis not present

## 2019-07-06 ENCOUNTER — Other Ambulatory Visit: Payer: Disability Insurance

## 2019-07-10 DIAGNOSIS — Z96652 Presence of left artificial knee joint: Secondary | ICD-10-CM | POA: Diagnosis not present

## 2019-07-10 DIAGNOSIS — Z87891 Personal history of nicotine dependence: Secondary | ICD-10-CM | POA: Diagnosis not present

## 2019-07-10 DIAGNOSIS — J45909 Unspecified asthma, uncomplicated: Secondary | ICD-10-CM | POA: Diagnosis not present

## 2019-07-10 DIAGNOSIS — K219 Gastro-esophageal reflux disease without esophagitis: Secondary | ICD-10-CM | POA: Diagnosis not present

## 2019-07-10 DIAGNOSIS — M1711 Unilateral primary osteoarthritis, right knee: Secondary | ICD-10-CM | POA: Diagnosis not present

## 2019-07-10 DIAGNOSIS — Z9181 History of falling: Secondary | ICD-10-CM | POA: Diagnosis not present

## 2019-07-10 DIAGNOSIS — I10 Essential (primary) hypertension: Secondary | ICD-10-CM | POA: Diagnosis not present

## 2019-07-13 DIAGNOSIS — K219 Gastro-esophageal reflux disease without esophagitis: Secondary | ICD-10-CM | POA: Diagnosis not present

## 2019-07-13 DIAGNOSIS — Z9181 History of falling: Secondary | ICD-10-CM | POA: Diagnosis not present

## 2019-07-13 DIAGNOSIS — Z96652 Presence of left artificial knee joint: Secondary | ICD-10-CM | POA: Diagnosis not present

## 2019-07-13 DIAGNOSIS — I10 Essential (primary) hypertension: Secondary | ICD-10-CM | POA: Diagnosis not present

## 2019-07-13 DIAGNOSIS — J45909 Unspecified asthma, uncomplicated: Secondary | ICD-10-CM | POA: Diagnosis not present

## 2019-07-13 DIAGNOSIS — M1711 Unilateral primary osteoarthritis, right knee: Secondary | ICD-10-CM | POA: Diagnosis not present

## 2019-07-13 DIAGNOSIS — Z87891 Personal history of nicotine dependence: Secondary | ICD-10-CM | POA: Diagnosis not present

## 2019-07-16 ENCOUNTER — Other Ambulatory Visit: Admission: RE | Admit: 2019-07-16 | Payer: Disability Insurance | Source: Ambulatory Visit

## 2019-07-17 DIAGNOSIS — Z01818 Encounter for other preprocedural examination: Secondary | ICD-10-CM | POA: Diagnosis not present

## 2019-07-17 DIAGNOSIS — Z20822 Contact with and (suspected) exposure to covid-19: Secondary | ICD-10-CM | POA: Diagnosis not present

## 2019-07-17 DIAGNOSIS — Z01812 Encounter for preprocedural laboratory examination: Secondary | ICD-10-CM | POA: Diagnosis not present

## 2019-07-17 DIAGNOSIS — M1711 Unilateral primary osteoarthritis, right knee: Secondary | ICD-10-CM | POA: Diagnosis not present

## 2019-07-18 ENCOUNTER — Ambulatory Visit: Admit: 2019-07-18 | Payer: Disability Insurance | Admitting: Orthopedic Surgery

## 2019-07-18 DIAGNOSIS — M1711 Unilateral primary osteoarthritis, right knee: Secondary | ICD-10-CM | POA: Diagnosis not present

## 2019-07-18 SURGERY — ARTHROPLASTY, KNEE, TOTAL
Anesthesia: Spinal | Site: Knee | Laterality: Right

## 2019-07-19 DIAGNOSIS — G8918 Other acute postprocedural pain: Secondary | ICD-10-CM | POA: Diagnosis not present

## 2019-07-19 DIAGNOSIS — J45909 Unspecified asthma, uncomplicated: Secondary | ICD-10-CM | POA: Diagnosis not present

## 2019-07-19 DIAGNOSIS — M1711 Unilateral primary osteoarthritis, right knee: Secondary | ICD-10-CM | POA: Diagnosis not present

## 2019-07-19 DIAGNOSIS — M25561 Pain in right knee: Secondary | ICD-10-CM | POA: Diagnosis not present

## 2019-07-19 DIAGNOSIS — K219 Gastro-esophageal reflux disease without esophagitis: Secondary | ICD-10-CM | POA: Diagnosis not present

## 2019-07-19 DIAGNOSIS — F329 Major depressive disorder, single episode, unspecified: Secondary | ICD-10-CM | POA: Diagnosis not present

## 2019-07-19 DIAGNOSIS — Z8679 Personal history of other diseases of the circulatory system: Secondary | ICD-10-CM | POA: Diagnosis not present

## 2019-07-19 DIAGNOSIS — M170 Bilateral primary osteoarthritis of knee: Secondary | ICD-10-CM | POA: Diagnosis not present

## 2019-07-19 DIAGNOSIS — I10 Essential (primary) hypertension: Secondary | ICD-10-CM | POA: Diagnosis not present

## 2019-07-19 DIAGNOSIS — F419 Anxiety disorder, unspecified: Secondary | ICD-10-CM | POA: Diagnosis not present

## 2019-07-19 DIAGNOSIS — Z885 Allergy status to narcotic agent status: Secondary | ICD-10-CM | POA: Diagnosis not present

## 2019-07-19 DIAGNOSIS — Z87891 Personal history of nicotine dependence: Secondary | ICD-10-CM | POA: Diagnosis not present

## 2019-07-20 DIAGNOSIS — Z87891 Personal history of nicotine dependence: Secondary | ICD-10-CM | POA: Diagnosis not present

## 2019-07-20 DIAGNOSIS — J45909 Unspecified asthma, uncomplicated: Secondary | ICD-10-CM | POA: Diagnosis not present

## 2019-07-20 DIAGNOSIS — F419 Anxiety disorder, unspecified: Secondary | ICD-10-CM | POA: Diagnosis not present

## 2019-07-20 DIAGNOSIS — M170 Bilateral primary osteoarthritis of knee: Secondary | ICD-10-CM | POA: Diagnosis not present

## 2019-07-20 DIAGNOSIS — Z8679 Personal history of other diseases of the circulatory system: Secondary | ICD-10-CM | POA: Diagnosis not present

## 2019-07-20 DIAGNOSIS — K219 Gastro-esophageal reflux disease without esophagitis: Secondary | ICD-10-CM | POA: Diagnosis not present

## 2019-07-20 DIAGNOSIS — F329 Major depressive disorder, single episode, unspecified: Secondary | ICD-10-CM | POA: Diagnosis not present

## 2019-07-20 DIAGNOSIS — I10 Essential (primary) hypertension: Secondary | ICD-10-CM | POA: Diagnosis not present

## 2019-07-20 DIAGNOSIS — Z885 Allergy status to narcotic agent status: Secondary | ICD-10-CM | POA: Diagnosis not present

## 2019-07-21 DIAGNOSIS — M1712 Unilateral primary osteoarthritis, left knee: Secondary | ICD-10-CM | POA: Diagnosis not present

## 2019-07-21 DIAGNOSIS — Z96651 Presence of right artificial knee joint: Secondary | ICD-10-CM | POA: Diagnosis not present

## 2019-07-21 DIAGNOSIS — Z471 Aftercare following joint replacement surgery: Secondary | ICD-10-CM | POA: Diagnosis not present

## 2019-07-21 DIAGNOSIS — I10 Essential (primary) hypertension: Secondary | ICD-10-CM | POA: Diagnosis not present

## 2019-07-21 DIAGNOSIS — Z7901 Long term (current) use of anticoagulants: Secondary | ICD-10-CM | POA: Diagnosis not present

## 2019-07-21 DIAGNOSIS — J45909 Unspecified asthma, uncomplicated: Secondary | ICD-10-CM | POA: Diagnosis not present

## 2019-07-21 DIAGNOSIS — Z87891 Personal history of nicotine dependence: Secondary | ICD-10-CM | POA: Diagnosis not present

## 2019-07-21 DIAGNOSIS — K219 Gastro-esophageal reflux disease without esophagitis: Secondary | ICD-10-CM | POA: Diagnosis not present

## 2019-07-23 DIAGNOSIS — Z471 Aftercare following joint replacement surgery: Secondary | ICD-10-CM | POA: Diagnosis not present

## 2019-07-23 DIAGNOSIS — Z96651 Presence of right artificial knee joint: Secondary | ICD-10-CM | POA: Diagnosis not present

## 2019-07-23 DIAGNOSIS — J45909 Unspecified asthma, uncomplicated: Secondary | ICD-10-CM | POA: Diagnosis not present

## 2019-07-23 DIAGNOSIS — M1712 Unilateral primary osteoarthritis, left knee: Secondary | ICD-10-CM | POA: Diagnosis not present

## 2019-07-23 DIAGNOSIS — Z7901 Long term (current) use of anticoagulants: Secondary | ICD-10-CM | POA: Diagnosis not present

## 2019-07-23 DIAGNOSIS — I10 Essential (primary) hypertension: Secondary | ICD-10-CM | POA: Diagnosis not present

## 2019-07-23 DIAGNOSIS — Z87891 Personal history of nicotine dependence: Secondary | ICD-10-CM | POA: Diagnosis not present

## 2019-07-23 DIAGNOSIS — K219 Gastro-esophageal reflux disease without esophagitis: Secondary | ICD-10-CM | POA: Diagnosis not present

## 2019-07-25 DIAGNOSIS — Z96651 Presence of right artificial knee joint: Secondary | ICD-10-CM | POA: Diagnosis not present

## 2019-07-25 DIAGNOSIS — Z7901 Long term (current) use of anticoagulants: Secondary | ICD-10-CM | POA: Diagnosis not present

## 2019-07-25 DIAGNOSIS — M1712 Unilateral primary osteoarthritis, left knee: Secondary | ICD-10-CM | POA: Diagnosis not present

## 2019-07-25 DIAGNOSIS — Z87891 Personal history of nicotine dependence: Secondary | ICD-10-CM | POA: Diagnosis not present

## 2019-07-25 DIAGNOSIS — K219 Gastro-esophageal reflux disease without esophagitis: Secondary | ICD-10-CM | POA: Diagnosis not present

## 2019-07-25 DIAGNOSIS — J45909 Unspecified asthma, uncomplicated: Secondary | ICD-10-CM | POA: Diagnosis not present

## 2019-07-25 DIAGNOSIS — I10 Essential (primary) hypertension: Secondary | ICD-10-CM | POA: Diagnosis not present

## 2019-07-25 DIAGNOSIS — Z471 Aftercare following joint replacement surgery: Secondary | ICD-10-CM | POA: Diagnosis not present

## 2019-07-26 DIAGNOSIS — M1712 Unilateral primary osteoarthritis, left knee: Secondary | ICD-10-CM | POA: Diagnosis not present

## 2019-07-26 DIAGNOSIS — K219 Gastro-esophageal reflux disease without esophagitis: Secondary | ICD-10-CM | POA: Diagnosis not present

## 2019-07-26 DIAGNOSIS — Z471 Aftercare following joint replacement surgery: Secondary | ICD-10-CM | POA: Diagnosis not present

## 2019-07-26 DIAGNOSIS — Z96651 Presence of right artificial knee joint: Secondary | ICD-10-CM | POA: Diagnosis not present

## 2019-07-26 DIAGNOSIS — J45909 Unspecified asthma, uncomplicated: Secondary | ICD-10-CM | POA: Diagnosis not present

## 2019-07-26 DIAGNOSIS — Z7901 Long term (current) use of anticoagulants: Secondary | ICD-10-CM | POA: Diagnosis not present

## 2019-07-26 DIAGNOSIS — Z87891 Personal history of nicotine dependence: Secondary | ICD-10-CM | POA: Diagnosis not present

## 2019-07-26 DIAGNOSIS — I10 Essential (primary) hypertension: Secondary | ICD-10-CM | POA: Diagnosis not present

## 2019-07-30 DIAGNOSIS — Z87891 Personal history of nicotine dependence: Secondary | ICD-10-CM | POA: Diagnosis not present

## 2019-07-30 DIAGNOSIS — Z7901 Long term (current) use of anticoagulants: Secondary | ICD-10-CM | POA: Diagnosis not present

## 2019-07-30 DIAGNOSIS — Z96651 Presence of right artificial knee joint: Secondary | ICD-10-CM | POA: Diagnosis not present

## 2019-07-30 DIAGNOSIS — I10 Essential (primary) hypertension: Secondary | ICD-10-CM | POA: Diagnosis not present

## 2019-07-30 DIAGNOSIS — J45909 Unspecified asthma, uncomplicated: Secondary | ICD-10-CM | POA: Diagnosis not present

## 2019-07-30 DIAGNOSIS — M1712 Unilateral primary osteoarthritis, left knee: Secondary | ICD-10-CM | POA: Diagnosis not present

## 2019-07-30 DIAGNOSIS — K219 Gastro-esophageal reflux disease without esophagitis: Secondary | ICD-10-CM | POA: Diagnosis not present

## 2019-07-30 DIAGNOSIS — Z471 Aftercare following joint replacement surgery: Secondary | ICD-10-CM | POA: Diagnosis not present

## 2019-08-01 DIAGNOSIS — J45909 Unspecified asthma, uncomplicated: Secondary | ICD-10-CM | POA: Diagnosis not present

## 2019-08-01 DIAGNOSIS — K219 Gastro-esophageal reflux disease without esophagitis: Secondary | ICD-10-CM | POA: Diagnosis not present

## 2019-08-01 DIAGNOSIS — Z96651 Presence of right artificial knee joint: Secondary | ICD-10-CM | POA: Diagnosis not present

## 2019-08-01 DIAGNOSIS — M1712 Unilateral primary osteoarthritis, left knee: Secondary | ICD-10-CM | POA: Diagnosis not present

## 2019-08-01 DIAGNOSIS — Z7901 Long term (current) use of anticoagulants: Secondary | ICD-10-CM | POA: Diagnosis not present

## 2019-08-01 DIAGNOSIS — I10 Essential (primary) hypertension: Secondary | ICD-10-CM | POA: Diagnosis not present

## 2019-08-01 DIAGNOSIS — Z87891 Personal history of nicotine dependence: Secondary | ICD-10-CM | POA: Diagnosis not present

## 2019-08-01 DIAGNOSIS — Z471 Aftercare following joint replacement surgery: Secondary | ICD-10-CM | POA: Diagnosis not present

## 2019-08-03 DIAGNOSIS — Z96651 Presence of right artificial knee joint: Secondary | ICD-10-CM | POA: Diagnosis not present

## 2019-08-03 DIAGNOSIS — Z7901 Long term (current) use of anticoagulants: Secondary | ICD-10-CM | POA: Diagnosis not present

## 2019-08-03 DIAGNOSIS — M1712 Unilateral primary osteoarthritis, left knee: Secondary | ICD-10-CM | POA: Diagnosis not present

## 2019-08-03 DIAGNOSIS — Z87891 Personal history of nicotine dependence: Secondary | ICD-10-CM | POA: Diagnosis not present

## 2019-08-03 DIAGNOSIS — Z471 Aftercare following joint replacement surgery: Secondary | ICD-10-CM | POA: Diagnosis not present

## 2019-08-03 DIAGNOSIS — J45909 Unspecified asthma, uncomplicated: Secondary | ICD-10-CM | POA: Diagnosis not present

## 2019-08-03 DIAGNOSIS — I10 Essential (primary) hypertension: Secondary | ICD-10-CM | POA: Diagnosis not present

## 2019-08-03 DIAGNOSIS — K219 Gastro-esophageal reflux disease without esophagitis: Secondary | ICD-10-CM | POA: Diagnosis not present

## 2019-08-07 DIAGNOSIS — K219 Gastro-esophageal reflux disease without esophagitis: Secondary | ICD-10-CM | POA: Diagnosis not present

## 2019-08-07 DIAGNOSIS — Z471 Aftercare following joint replacement surgery: Secondary | ICD-10-CM | POA: Diagnosis not present

## 2019-08-07 DIAGNOSIS — Z87891 Personal history of nicotine dependence: Secondary | ICD-10-CM | POA: Diagnosis not present

## 2019-08-07 DIAGNOSIS — M1712 Unilateral primary osteoarthritis, left knee: Secondary | ICD-10-CM | POA: Diagnosis not present

## 2019-08-07 DIAGNOSIS — Z7901 Long term (current) use of anticoagulants: Secondary | ICD-10-CM | POA: Diagnosis not present

## 2019-08-07 DIAGNOSIS — I10 Essential (primary) hypertension: Secondary | ICD-10-CM | POA: Diagnosis not present

## 2019-08-07 DIAGNOSIS — J45909 Unspecified asthma, uncomplicated: Secondary | ICD-10-CM | POA: Diagnosis not present

## 2019-08-07 DIAGNOSIS — Z96651 Presence of right artificial knee joint: Secondary | ICD-10-CM | POA: Diagnosis not present

## 2019-08-09 DIAGNOSIS — K219 Gastro-esophageal reflux disease without esophagitis: Secondary | ICD-10-CM | POA: Diagnosis not present

## 2019-08-09 DIAGNOSIS — Z471 Aftercare following joint replacement surgery: Secondary | ICD-10-CM | POA: Diagnosis not present

## 2019-08-09 DIAGNOSIS — Z7901 Long term (current) use of anticoagulants: Secondary | ICD-10-CM | POA: Diagnosis not present

## 2019-08-09 DIAGNOSIS — J45909 Unspecified asthma, uncomplicated: Secondary | ICD-10-CM | POA: Diagnosis not present

## 2019-08-09 DIAGNOSIS — Z96651 Presence of right artificial knee joint: Secondary | ICD-10-CM | POA: Diagnosis not present

## 2019-08-09 DIAGNOSIS — M1712 Unilateral primary osteoarthritis, left knee: Secondary | ICD-10-CM | POA: Diagnosis not present

## 2019-08-09 DIAGNOSIS — Z87891 Personal history of nicotine dependence: Secondary | ICD-10-CM | POA: Diagnosis not present

## 2019-08-09 DIAGNOSIS — I10 Essential (primary) hypertension: Secondary | ICD-10-CM | POA: Diagnosis not present

## 2019-08-13 DIAGNOSIS — I10 Essential (primary) hypertension: Secondary | ICD-10-CM | POA: Diagnosis not present

## 2019-08-13 DIAGNOSIS — K219 Gastro-esophageal reflux disease without esophagitis: Secondary | ICD-10-CM | POA: Diagnosis not present

## 2019-08-13 DIAGNOSIS — Z471 Aftercare following joint replacement surgery: Secondary | ICD-10-CM | POA: Diagnosis not present

## 2019-08-13 DIAGNOSIS — Z7901 Long term (current) use of anticoagulants: Secondary | ICD-10-CM | POA: Diagnosis not present

## 2019-08-13 DIAGNOSIS — Z87891 Personal history of nicotine dependence: Secondary | ICD-10-CM | POA: Diagnosis not present

## 2019-08-13 DIAGNOSIS — M1712 Unilateral primary osteoarthritis, left knee: Secondary | ICD-10-CM | POA: Diagnosis not present

## 2019-08-13 DIAGNOSIS — Z96651 Presence of right artificial knee joint: Secondary | ICD-10-CM | POA: Diagnosis not present

## 2019-08-13 DIAGNOSIS — J45909 Unspecified asthma, uncomplicated: Secondary | ICD-10-CM | POA: Diagnosis not present

## 2019-08-15 DIAGNOSIS — I10 Essential (primary) hypertension: Secondary | ICD-10-CM | POA: Diagnosis not present

## 2019-08-15 DIAGNOSIS — Z7901 Long term (current) use of anticoagulants: Secondary | ICD-10-CM | POA: Diagnosis not present

## 2019-08-15 DIAGNOSIS — J45909 Unspecified asthma, uncomplicated: Secondary | ICD-10-CM | POA: Diagnosis not present

## 2019-08-15 DIAGNOSIS — K219 Gastro-esophageal reflux disease without esophagitis: Secondary | ICD-10-CM | POA: Diagnosis not present

## 2019-08-15 DIAGNOSIS — M1712 Unilateral primary osteoarthritis, left knee: Secondary | ICD-10-CM | POA: Diagnosis not present

## 2019-08-15 DIAGNOSIS — Z96651 Presence of right artificial knee joint: Secondary | ICD-10-CM | POA: Diagnosis not present

## 2019-08-15 DIAGNOSIS — Z471 Aftercare following joint replacement surgery: Secondary | ICD-10-CM | POA: Diagnosis not present

## 2019-08-15 DIAGNOSIS — Z87891 Personal history of nicotine dependence: Secondary | ICD-10-CM | POA: Diagnosis not present

## 2019-08-22 DIAGNOSIS — M25561 Pain in right knee: Secondary | ICD-10-CM | POA: Diagnosis not present

## 2019-08-22 DIAGNOSIS — M25661 Stiffness of right knee, not elsewhere classified: Secondary | ICD-10-CM | POA: Diagnosis not present

## 2019-08-27 DIAGNOSIS — Z96651 Presence of right artificial knee joint: Secondary | ICD-10-CM | POA: Diagnosis not present

## 2019-09-05 DIAGNOSIS — Z96651 Presence of right artificial knee joint: Secondary | ICD-10-CM | POA: Diagnosis not present

## 2019-09-05 DIAGNOSIS — M25561 Pain in right knee: Secondary | ICD-10-CM | POA: Diagnosis not present

## 2019-10-08 DIAGNOSIS — Z96659 Presence of unspecified artificial knee joint: Secondary | ICD-10-CM | POA: Diagnosis not present

## 2019-10-08 DIAGNOSIS — M1712 Unilateral primary osteoarthritis, left knee: Secondary | ICD-10-CM | POA: Diagnosis not present

## 2019-10-08 DIAGNOSIS — M170 Bilateral primary osteoarthritis of knee: Secondary | ICD-10-CM | POA: Diagnosis not present

## 2019-11-08 DIAGNOSIS — R634 Abnormal weight loss: Secondary | ICD-10-CM | POA: Diagnosis not present

## 2019-11-08 DIAGNOSIS — M170 Bilateral primary osteoarthritis of knee: Secondary | ICD-10-CM | POA: Diagnosis not present

## 2019-11-08 DIAGNOSIS — K219 Gastro-esophageal reflux disease without esophagitis: Secondary | ICD-10-CM | POA: Diagnosis not present

## 2019-11-08 DIAGNOSIS — Z6832 Body mass index (BMI) 32.0-32.9, adult: Secondary | ICD-10-CM | POA: Diagnosis not present

## 2019-11-08 DIAGNOSIS — I10 Essential (primary) hypertension: Secondary | ICD-10-CM | POA: Diagnosis not present

## 2019-11-08 DIAGNOSIS — E7800 Pure hypercholesterolemia, unspecified: Secondary | ICD-10-CM | POA: Diagnosis not present

## 2019-12-31 DIAGNOSIS — M1712 Unilateral primary osteoarthritis, left knee: Secondary | ICD-10-CM | POA: Diagnosis not present

## 2020-04-21 DIAGNOSIS — M1712 Unilateral primary osteoarthritis, left knee: Secondary | ICD-10-CM | POA: Diagnosis not present

## 2020-04-21 DIAGNOSIS — Z96651 Presence of right artificial knee joint: Secondary | ICD-10-CM | POA: Diagnosis not present

## 2020-05-13 DIAGNOSIS — E7800 Pure hypercholesterolemia, unspecified: Secondary | ICD-10-CM | POA: Diagnosis not present

## 2020-05-13 DIAGNOSIS — Z01818 Encounter for other preprocedural examination: Secondary | ICD-10-CM | POA: Diagnosis not present

## 2020-05-13 DIAGNOSIS — J45909 Unspecified asthma, uncomplicated: Secondary | ICD-10-CM | POA: Diagnosis not present

## 2020-05-13 DIAGNOSIS — R634 Abnormal weight loss: Secondary | ICD-10-CM | POA: Diagnosis not present

## 2020-05-13 DIAGNOSIS — Z6826 Body mass index (BMI) 26.0-26.9, adult: Secondary | ICD-10-CM | POA: Diagnosis not present

## 2020-05-13 DIAGNOSIS — L918 Other hypertrophic disorders of the skin: Secondary | ICD-10-CM | POA: Diagnosis not present

## 2020-05-13 DIAGNOSIS — Z1331 Encounter for screening for depression: Secondary | ICD-10-CM | POA: Diagnosis not present

## 2020-05-13 DIAGNOSIS — I10 Essential (primary) hypertension: Secondary | ICD-10-CM | POA: Diagnosis not present

## 2020-05-13 DIAGNOSIS — M170 Bilateral primary osteoarthritis of knee: Secondary | ICD-10-CM | POA: Diagnosis not present

## 2020-05-20 DIAGNOSIS — M25562 Pain in left knee: Secondary | ICD-10-CM | POA: Diagnosis not present

## 2020-05-20 DIAGNOSIS — M25662 Stiffness of left knee, not elsewhere classified: Secondary | ICD-10-CM | POA: Diagnosis not present

## 2020-05-20 DIAGNOSIS — M1712 Unilateral primary osteoarthritis, left knee: Secondary | ICD-10-CM | POA: Diagnosis not present

## 2020-05-29 DIAGNOSIS — Z885 Allergy status to narcotic agent status: Secondary | ICD-10-CM | POA: Diagnosis not present

## 2020-05-29 DIAGNOSIS — I10 Essential (primary) hypertension: Secondary | ICD-10-CM | POA: Diagnosis not present

## 2020-05-29 DIAGNOSIS — G8918 Other acute postprocedural pain: Secondary | ICD-10-CM | POA: Diagnosis not present

## 2020-05-29 DIAGNOSIS — M25562 Pain in left knee: Secondary | ICD-10-CM | POA: Diagnosis not present

## 2020-05-29 DIAGNOSIS — K219 Gastro-esophageal reflux disease without esophagitis: Secondary | ICD-10-CM | POA: Diagnosis not present

## 2020-05-29 DIAGNOSIS — Z96652 Presence of left artificial knee joint: Secondary | ICD-10-CM | POA: Diagnosis not present

## 2020-05-29 DIAGNOSIS — M25762 Osteophyte, left knee: Secondary | ICD-10-CM | POA: Diagnosis not present

## 2020-05-29 DIAGNOSIS — M1712 Unilateral primary osteoarthritis, left knee: Secondary | ICD-10-CM | POA: Diagnosis not present

## 2020-05-29 DIAGNOSIS — J45909 Unspecified asthma, uncomplicated: Secondary | ICD-10-CM | POA: Diagnosis not present

## 2020-05-30 DIAGNOSIS — Z885 Allergy status to narcotic agent status: Secondary | ICD-10-CM | POA: Diagnosis not present

## 2020-05-30 DIAGNOSIS — M1712 Unilateral primary osteoarthritis, left knee: Secondary | ICD-10-CM | POA: Diagnosis not present

## 2020-05-30 DIAGNOSIS — I10 Essential (primary) hypertension: Secondary | ICD-10-CM | POA: Diagnosis not present

## 2020-05-30 DIAGNOSIS — J45909 Unspecified asthma, uncomplicated: Secondary | ICD-10-CM | POA: Diagnosis not present

## 2020-05-30 DIAGNOSIS — M25762 Osteophyte, left knee: Secondary | ICD-10-CM | POA: Diagnosis not present

## 2020-05-30 DIAGNOSIS — K219 Gastro-esophageal reflux disease without esophagitis: Secondary | ICD-10-CM | POA: Diagnosis not present

## 2020-06-10 ENCOUNTER — Other Ambulatory Visit: Payer: Self-pay | Admitting: Physician Assistant

## 2020-06-10 ENCOUNTER — Ambulatory Visit
Admission: RE | Admit: 2020-06-10 | Discharge: 2020-06-10 | Disposition: A | Discharge status: Home / Self Care | Payer: Medicare HMO | Source: Ambulatory Visit | Attending: Physician Assistant | Admitting: Physician Assistant

## 2020-06-10 ENCOUNTER — Other Ambulatory Visit: Payer: Self-pay

## 2020-06-10 DIAGNOSIS — R2242 Localized swelling, mass and lump, left lower limb: Secondary | ICD-10-CM

## 2020-06-10 DIAGNOSIS — R600 Localized edema: Secondary | ICD-10-CM | POA: Diagnosis not present

## 2020-06-10 DIAGNOSIS — M79605 Pain in left leg: Secondary | ICD-10-CM | POA: Diagnosis not present

## 2020-06-11 DIAGNOSIS — K219 Gastro-esophageal reflux disease without esophagitis: Secondary | ICD-10-CM | POA: Diagnosis not present

## 2020-06-11 DIAGNOSIS — F32A Depression, unspecified: Secondary | ICD-10-CM | POA: Diagnosis not present

## 2020-06-11 DIAGNOSIS — Z471 Aftercare following joint replacement surgery: Secondary | ICD-10-CM | POA: Diagnosis not present

## 2020-06-11 DIAGNOSIS — M1990 Unspecified osteoarthritis, unspecified site: Secondary | ICD-10-CM | POA: Diagnosis not present

## 2020-06-11 DIAGNOSIS — F419 Anxiety disorder, unspecified: Secondary | ICD-10-CM | POA: Diagnosis not present

## 2020-06-11 DIAGNOSIS — Z87891 Personal history of nicotine dependence: Secondary | ICD-10-CM | POA: Diagnosis not present

## 2020-06-11 DIAGNOSIS — J45909 Unspecified asthma, uncomplicated: Secondary | ICD-10-CM | POA: Diagnosis not present

## 2020-06-11 DIAGNOSIS — I10 Essential (primary) hypertension: Secondary | ICD-10-CM | POA: Diagnosis not present

## 2020-06-11 DIAGNOSIS — Z96653 Presence of artificial knee joint, bilateral: Secondary | ICD-10-CM | POA: Diagnosis not present

## 2020-06-17 DIAGNOSIS — I10 Essential (primary) hypertension: Secondary | ICD-10-CM | POA: Diagnosis not present

## 2020-06-17 DIAGNOSIS — J45909 Unspecified asthma, uncomplicated: Secondary | ICD-10-CM | POA: Diagnosis not present

## 2020-06-17 DIAGNOSIS — Z96653 Presence of artificial knee joint, bilateral: Secondary | ICD-10-CM | POA: Diagnosis not present

## 2020-06-17 DIAGNOSIS — Z87891 Personal history of nicotine dependence: Secondary | ICD-10-CM | POA: Diagnosis not present

## 2020-06-17 DIAGNOSIS — Z471 Aftercare following joint replacement surgery: Secondary | ICD-10-CM | POA: Diagnosis not present

## 2020-06-17 DIAGNOSIS — K219 Gastro-esophageal reflux disease without esophagitis: Secondary | ICD-10-CM | POA: Diagnosis not present

## 2020-06-17 DIAGNOSIS — F419 Anxiety disorder, unspecified: Secondary | ICD-10-CM | POA: Diagnosis not present

## 2020-06-17 DIAGNOSIS — F32A Depression, unspecified: Secondary | ICD-10-CM | POA: Diagnosis not present

## 2020-06-17 DIAGNOSIS — M1990 Unspecified osteoarthritis, unspecified site: Secondary | ICD-10-CM | POA: Diagnosis not present

## 2020-06-18 DIAGNOSIS — J45909 Unspecified asthma, uncomplicated: Secondary | ICD-10-CM | POA: Diagnosis not present

## 2020-06-18 DIAGNOSIS — K219 Gastro-esophageal reflux disease without esophagitis: Secondary | ICD-10-CM | POA: Diagnosis not present

## 2020-06-18 DIAGNOSIS — Z471 Aftercare following joint replacement surgery: Secondary | ICD-10-CM | POA: Diagnosis not present

## 2020-06-18 DIAGNOSIS — F419 Anxiety disorder, unspecified: Secondary | ICD-10-CM | POA: Diagnosis not present

## 2020-06-18 DIAGNOSIS — M1990 Unspecified osteoarthritis, unspecified site: Secondary | ICD-10-CM | POA: Diagnosis not present

## 2020-06-18 DIAGNOSIS — Z96653 Presence of artificial knee joint, bilateral: Secondary | ICD-10-CM | POA: Diagnosis not present

## 2020-06-18 DIAGNOSIS — F32A Depression, unspecified: Secondary | ICD-10-CM | POA: Diagnosis not present

## 2020-06-18 DIAGNOSIS — Z87891 Personal history of nicotine dependence: Secondary | ICD-10-CM | POA: Diagnosis not present

## 2020-06-18 DIAGNOSIS — I10 Essential (primary) hypertension: Secondary | ICD-10-CM | POA: Diagnosis not present

## 2020-06-23 DIAGNOSIS — Z471 Aftercare following joint replacement surgery: Secondary | ICD-10-CM | POA: Diagnosis not present

## 2020-06-23 DIAGNOSIS — Z87891 Personal history of nicotine dependence: Secondary | ICD-10-CM | POA: Diagnosis not present

## 2020-06-23 DIAGNOSIS — K219 Gastro-esophageal reflux disease without esophagitis: Secondary | ICD-10-CM | POA: Diagnosis not present

## 2020-06-23 DIAGNOSIS — F419 Anxiety disorder, unspecified: Secondary | ICD-10-CM | POA: Diagnosis not present

## 2020-06-23 DIAGNOSIS — M1990 Unspecified osteoarthritis, unspecified site: Secondary | ICD-10-CM | POA: Diagnosis not present

## 2020-06-23 DIAGNOSIS — J45909 Unspecified asthma, uncomplicated: Secondary | ICD-10-CM | POA: Diagnosis not present

## 2020-06-23 DIAGNOSIS — F32A Depression, unspecified: Secondary | ICD-10-CM | POA: Diagnosis not present

## 2020-06-23 DIAGNOSIS — Z96653 Presence of artificial knee joint, bilateral: Secondary | ICD-10-CM | POA: Diagnosis not present

## 2020-06-23 DIAGNOSIS — I10 Essential (primary) hypertension: Secondary | ICD-10-CM | POA: Diagnosis not present

## 2020-06-25 DIAGNOSIS — I10 Essential (primary) hypertension: Secondary | ICD-10-CM | POA: Diagnosis not present

## 2020-06-25 DIAGNOSIS — F419 Anxiety disorder, unspecified: Secondary | ICD-10-CM | POA: Diagnosis not present

## 2020-06-25 DIAGNOSIS — M1990 Unspecified osteoarthritis, unspecified site: Secondary | ICD-10-CM | POA: Diagnosis not present

## 2020-06-25 DIAGNOSIS — Z471 Aftercare following joint replacement surgery: Secondary | ICD-10-CM | POA: Diagnosis not present

## 2020-06-25 DIAGNOSIS — J45909 Unspecified asthma, uncomplicated: Secondary | ICD-10-CM | POA: Diagnosis not present

## 2020-06-25 DIAGNOSIS — Z87891 Personal history of nicotine dependence: Secondary | ICD-10-CM | POA: Diagnosis not present

## 2020-06-25 DIAGNOSIS — K219 Gastro-esophageal reflux disease without esophagitis: Secondary | ICD-10-CM | POA: Diagnosis not present

## 2020-06-25 DIAGNOSIS — Z96653 Presence of artificial knee joint, bilateral: Secondary | ICD-10-CM | POA: Diagnosis not present

## 2020-06-25 DIAGNOSIS — F32A Depression, unspecified: Secondary | ICD-10-CM | POA: Diagnosis not present

## 2020-06-27 DIAGNOSIS — Z96653 Presence of artificial knee joint, bilateral: Secondary | ICD-10-CM | POA: Diagnosis not present

## 2020-06-27 DIAGNOSIS — K219 Gastro-esophageal reflux disease without esophagitis: Secondary | ICD-10-CM | POA: Diagnosis not present

## 2020-06-27 DIAGNOSIS — M1990 Unspecified osteoarthritis, unspecified site: Secondary | ICD-10-CM | POA: Diagnosis not present

## 2020-06-27 DIAGNOSIS — I10 Essential (primary) hypertension: Secondary | ICD-10-CM | POA: Diagnosis not present

## 2020-06-27 DIAGNOSIS — F419 Anxiety disorder, unspecified: Secondary | ICD-10-CM | POA: Diagnosis not present

## 2020-06-27 DIAGNOSIS — Z471 Aftercare following joint replacement surgery: Secondary | ICD-10-CM | POA: Diagnosis not present

## 2020-06-27 DIAGNOSIS — J45909 Unspecified asthma, uncomplicated: Secondary | ICD-10-CM | POA: Diagnosis not present

## 2020-06-27 DIAGNOSIS — F32A Depression, unspecified: Secondary | ICD-10-CM | POA: Diagnosis not present

## 2020-06-27 DIAGNOSIS — Z87891 Personal history of nicotine dependence: Secondary | ICD-10-CM | POA: Diagnosis not present

## 2020-06-30 DIAGNOSIS — J45909 Unspecified asthma, uncomplicated: Secondary | ICD-10-CM | POA: Diagnosis not present

## 2020-06-30 DIAGNOSIS — M1990 Unspecified osteoarthritis, unspecified site: Secondary | ICD-10-CM | POA: Diagnosis not present

## 2020-06-30 DIAGNOSIS — Z96653 Presence of artificial knee joint, bilateral: Secondary | ICD-10-CM | POA: Diagnosis not present

## 2020-06-30 DIAGNOSIS — Z87891 Personal history of nicotine dependence: Secondary | ICD-10-CM | POA: Diagnosis not present

## 2020-06-30 DIAGNOSIS — I10 Essential (primary) hypertension: Secondary | ICD-10-CM | POA: Diagnosis not present

## 2020-06-30 DIAGNOSIS — Z471 Aftercare following joint replacement surgery: Secondary | ICD-10-CM | POA: Diagnosis not present

## 2020-06-30 DIAGNOSIS — F32A Depression, unspecified: Secondary | ICD-10-CM | POA: Diagnosis not present

## 2020-06-30 DIAGNOSIS — F419 Anxiety disorder, unspecified: Secondary | ICD-10-CM | POA: Diagnosis not present

## 2020-06-30 DIAGNOSIS — K219 Gastro-esophageal reflux disease without esophagitis: Secondary | ICD-10-CM | POA: Diagnosis not present

## 2020-07-02 DIAGNOSIS — M1990 Unspecified osteoarthritis, unspecified site: Secondary | ICD-10-CM | POA: Diagnosis not present

## 2020-07-02 DIAGNOSIS — Z471 Aftercare following joint replacement surgery: Secondary | ICD-10-CM | POA: Diagnosis not present

## 2020-07-02 DIAGNOSIS — Z96653 Presence of artificial knee joint, bilateral: Secondary | ICD-10-CM | POA: Diagnosis not present

## 2020-07-02 DIAGNOSIS — Z87891 Personal history of nicotine dependence: Secondary | ICD-10-CM | POA: Diagnosis not present

## 2020-07-02 DIAGNOSIS — F419 Anxiety disorder, unspecified: Secondary | ICD-10-CM | POA: Diagnosis not present

## 2020-07-02 DIAGNOSIS — J45909 Unspecified asthma, uncomplicated: Secondary | ICD-10-CM | POA: Diagnosis not present

## 2020-07-02 DIAGNOSIS — I10 Essential (primary) hypertension: Secondary | ICD-10-CM | POA: Diagnosis not present

## 2020-07-02 DIAGNOSIS — K219 Gastro-esophageal reflux disease without esophagitis: Secondary | ICD-10-CM | POA: Diagnosis not present

## 2020-07-02 DIAGNOSIS — F32A Depression, unspecified: Secondary | ICD-10-CM | POA: Diagnosis not present

## 2020-07-07 DIAGNOSIS — J45909 Unspecified asthma, uncomplicated: Secondary | ICD-10-CM | POA: Diagnosis not present

## 2020-07-07 DIAGNOSIS — Z87891 Personal history of nicotine dependence: Secondary | ICD-10-CM | POA: Diagnosis not present

## 2020-07-07 DIAGNOSIS — M1990 Unspecified osteoarthritis, unspecified site: Secondary | ICD-10-CM | POA: Diagnosis not present

## 2020-07-07 DIAGNOSIS — Z96653 Presence of artificial knee joint, bilateral: Secondary | ICD-10-CM | POA: Diagnosis not present

## 2020-07-07 DIAGNOSIS — Z471 Aftercare following joint replacement surgery: Secondary | ICD-10-CM | POA: Diagnosis not present

## 2020-07-07 DIAGNOSIS — I10 Essential (primary) hypertension: Secondary | ICD-10-CM | POA: Diagnosis not present

## 2020-07-07 DIAGNOSIS — K219 Gastro-esophageal reflux disease without esophagitis: Secondary | ICD-10-CM | POA: Diagnosis not present

## 2020-07-07 DIAGNOSIS — F419 Anxiety disorder, unspecified: Secondary | ICD-10-CM | POA: Diagnosis not present

## 2020-07-07 DIAGNOSIS — F32A Depression, unspecified: Secondary | ICD-10-CM | POA: Diagnosis not present

## 2020-07-09 DIAGNOSIS — M1990 Unspecified osteoarthritis, unspecified site: Secondary | ICD-10-CM | POA: Diagnosis not present

## 2020-07-09 DIAGNOSIS — Z96653 Presence of artificial knee joint, bilateral: Secondary | ICD-10-CM | POA: Diagnosis not present

## 2020-07-09 DIAGNOSIS — Z471 Aftercare following joint replacement surgery: Secondary | ICD-10-CM | POA: Diagnosis not present

## 2020-07-09 DIAGNOSIS — F32A Depression, unspecified: Secondary | ICD-10-CM | POA: Diagnosis not present

## 2020-07-09 DIAGNOSIS — K219 Gastro-esophageal reflux disease without esophagitis: Secondary | ICD-10-CM | POA: Diagnosis not present

## 2020-07-09 DIAGNOSIS — I10 Essential (primary) hypertension: Secondary | ICD-10-CM | POA: Diagnosis not present

## 2020-07-09 DIAGNOSIS — Z87891 Personal history of nicotine dependence: Secondary | ICD-10-CM | POA: Diagnosis not present

## 2020-07-09 DIAGNOSIS — J45909 Unspecified asthma, uncomplicated: Secondary | ICD-10-CM | POA: Diagnosis not present

## 2020-07-09 DIAGNOSIS — F419 Anxiety disorder, unspecified: Secondary | ICD-10-CM | POA: Diagnosis not present

## 2020-11-28 ENCOUNTER — Other Ambulatory Visit: Payer: Self-pay | Admitting: Physician Assistant

## 2020-12-01 ENCOUNTER — Inpatient Hospital Stay
Admission: RE | Admit: 2020-12-01 | Discharge: 2020-12-01 | Disposition: A | Discharge status: Home / Self Care | Payer: Self-pay | Source: Ambulatory Visit | Attending: *Deleted | Admitting: *Deleted

## 2020-12-01 ENCOUNTER — Other Ambulatory Visit: Payer: Self-pay | Admitting: *Deleted

## 2020-12-01 DIAGNOSIS — Z1231 Encounter for screening mammogram for malignant neoplasm of breast: Secondary | ICD-10-CM

## 2021-03-08 ENCOUNTER — Emergency Department: Payer: Medicare HMO

## 2021-03-08 ENCOUNTER — Emergency Department
Admission: EM | Admit: 2021-03-08 | Discharge: 2021-03-08 | Disposition: A | Discharge status: Home / Self Care | Payer: Medicare HMO | Attending: Emergency Medicine | Admitting: Emergency Medicine

## 2021-03-08 ENCOUNTER — Other Ambulatory Visit: Payer: Self-pay

## 2021-03-08 DIAGNOSIS — I10 Essential (primary) hypertension: Secondary | ICD-10-CM | POA: Diagnosis not present

## 2021-03-08 DIAGNOSIS — R42 Dizziness and giddiness: Secondary | ICD-10-CM | POA: Insufficient documentation

## 2021-03-08 LAB — DIFFERENTIAL
Abs Immature Granulocytes: 0.02 10*3/uL (ref 0.00–0.07)
Basophils Absolute: 0 10*3/uL (ref 0.0–0.1)
Basophils Relative: 1 %
Eosinophils Absolute: 0.1 10*3/uL (ref 0.0–0.5)
Eosinophils Relative: 2 %
Immature Granulocytes: 0 %
Lymphocytes Relative: 43 %
Lymphs Abs: 2.3 10*3/uL (ref 0.7–4.0)
Monocytes Absolute: 0.4 10*3/uL (ref 0.1–1.0)
Monocytes Relative: 8 %
Neutro Abs: 2.5 10*3/uL (ref 1.7–7.7)
Neutrophils Relative %: 46 %

## 2021-03-08 LAB — CBC
HCT: 39.8 % (ref 36.0–46.0)
Hemoglobin: 13.6 g/dL (ref 12.0–15.0)
MCH: 32.5 pg (ref 26.0–34.0)
MCHC: 34.2 g/dL (ref 30.0–36.0)
MCV: 95 fL (ref 80.0–100.0)
Platelets: 260 10*3/uL (ref 150–400)
RBC: 4.19 MIL/uL (ref 3.87–5.11)
RDW: 12.4 % (ref 11.5–15.5)
WBC: 5.3 10*3/uL (ref 4.0–10.5)
nRBC: 0 % (ref 0.0–0.2)

## 2021-03-08 LAB — COMPREHENSIVE METABOLIC PANEL
ALT: 11 U/L (ref 0–44)
AST: 19 U/L (ref 15–41)
Albumin: 4.3 g/dL (ref 3.5–5.0)
Alkaline Phosphatase: 72 U/L (ref 38–126)
Anion gap: 15 (ref 5–15)
BUN: 14 mg/dL (ref 6–20)
CO2: 29 mmol/L (ref 22–32)
Calcium: 9.5 mg/dL (ref 8.9–10.3)
Chloride: 98 mmol/L (ref 98–111)
Creatinine, Ser: 0.72 mg/dL (ref 0.44–1.00)
GFR, Estimated: >60 mL/min (ref >60)
Glucose, Bld: 69 mg/dL — ABNORMAL LOW (ref 70–99)
Potassium: 3.7 mmol/L (ref 3.5–5.1)
Sodium: 142 mmol/L (ref 135–145)
Total Bilirubin: 1 mg/dL (ref 0.3–1.2)
Total Protein: 7.2 g/dL (ref 6.5–8.1)

## 2021-03-08 LAB — URINALYSIS, COMPLETE (UACMP) WITH MICROSCOPIC
Bilirubin Urine: NEGATIVE
Glucose, UA: NEGATIVE mg/dL
Hgb urine dipstick: NEGATIVE
Ketones, ur: NEGATIVE mg/dL
Leukocytes,Ua: NEGATIVE
Nitrite: NEGATIVE
Protein, ur: NEGATIVE mg/dL
Specific Gravity, Urine: 1.011 (ref 1.005–1.030)
pH: 8 (ref 5.0–8.0)

## 2021-03-08 LAB — CBG MONITORING, ED: Glucose-Capillary: 69 mg/dL — ABNORMAL LOW (ref 70–99)

## 2021-03-08 MED ORDER — AMLODIPINE BESYLATE 5 MG PO TABS
5.0000 mg | ORAL_TABLET | Freq: Once | ORAL | Status: AC
  Administered 2021-03-08: 5 mg via ORAL
  Filled 2021-03-08: qty 1

## 2021-03-08 MED ORDER — AMLODIPINE BESYLATE 5 MG PO TABS: 5.0000 mg | ORAL_TABLET | Freq: Every day | ORAL | 0 refills | Status: AC

## 2021-03-08 NOTE — ED Provider Notes (Signed)
[facility] Emergency Department Provider Note   ____________________________________________   Event Date/Time   First MD Initiated Contact with Patient 03/08/21 [phone]     (approximate)  I have reviewed the triage vital signs and the nursing notes.   HISTORY  Chief Complaint Dizziness    HPI Christina Carter is a 60 y.o. female who presents for lightheadedness  LOCATION: Generalized DURATION: Began this morning upon awakening TIMING: Intermittent and states has been present over the last 2 weeks and slightly resolved since onset this morning SEVERITY: Moderate QUALITY: Lightheadedness CONTEXT: Patient states that over the last 2 weeks especially in the morning she has been having episodes of of severe lightheadedness that is worse with moving her head as well as the sensation of pounding on the side of her head when she lays down MODIFYING FACTORS: Moving her head worsens this lightheadedness and it is partially relieved at rest ASSOCIATED SYMPTOMS: Denies   Per medical record review, patient has history of ruptured aneurysm 1 year prior to arrival          Past Medical History:  Diagnosis Date   Osteoarthritis     There are no problems to display for this patient.   History reviewed. No pertinent surgical history.  Prior to Admission medications   Medication Sig Start Date End Date Taking? Authorizing Provider  amLODipine (NORVASC) 5 MG tablet Take 1 tablet (5 mg total) by mouth daily for 7 days. 03/08/21 03/15/21 Yes Naaman Plummer, MD    Allergies Codeine  History reviewed. No pertinent family history.  Social History    Review of Systems Constitutional: No fever/chills Eyes: No visual changes. ENT: No sore throat. Cardiovascular: Denies chest pain. Respiratory: Denies shortness of breath. Gastrointestinal: No abdominal pain.  No nausea, no vomiting.  No diarrhea. Genitourinary: Negative for dysuria. Musculoskeletal:  Negative for acute arthralgias Skin: Negative for rash. Neurological: Positive for intermittent lightheadedness.  Negative for headaches, weakness/numbness/paresthesias in any extremity Psychiatric: Negative for suicidal ideation/homicidal ideation   ____________________________________________   PHYSICAL EXAM:  VITAL SIGNS: ED Triage Vitals  Enc Vitals Group     BP 03/08/21 0742 (!) 180/107     Pulse Rate 03/08/21 0742 86     Resp 03/08/21 0742 18     Temp 03/08/21 0742 97.7 F (36.5 C)     Temp Source 03/08/21 0742 Oral     SpO2 03/08/21 0742 98 %     Weight 03/08/21 0741 155 lb (70.3 kg)     Height 03/08/21 0741 5\' 4"  (1.626 m)     Head Circumference --      Peak Flow --      Pain Score 03/08/21 0751 0     Pain Loc --      Pain Edu? --      Excl. in [place]? --    Constitutional: Alert and oriented. Well appearing and in no acute distress. Eyes: Conjunctivae are normal. PERRL. Head: Atraumatic. Nose: No congestion/rhinnorhea. Mouth/Throat: Mucous membranes are moist. Neck: No stridor Cardiovascular: Grossly normal heart sounds.  Good peripheral circulation. Respiratory: Normal respiratory effort.  No retractions. Gastrointestinal: Soft and nontender. No distention. Musculoskeletal: No obvious deformities Neurologic:  Normal speech and language. No gross focal neurologic deficits are appreciated. Skin:  Skin is warm and dry. No rash noted. Psychiatric: Mood and affect are normal. Speech and behavior are normal.  ____________________________________________   LABS (all labs ordered are listed, but only abnormal results are displayed)  Labs Reviewed  COMPREHENSIVE  METABOLIC PANEL - Abnormal; Notable for the following components:      Result Value   Glucose, Bld 69 (*)    All other components within normal limits  URINALYSIS, COMPLETE (UACMP) WITH MICROSCOPIC - Abnormal; Notable for the following components:   Color, Urine YELLOW (*)    APPearance HAZY (*)     Bacteria, UA FEW (*)    All other components within normal limits  CBG MONITORING, ED - Abnormal; Notable for the following components:   Glucose-Capillary 69 (*)    All other components within normal limits  CBC  DIFFERENTIAL  CBG MONITORING, ED  POC URINE PREG, ED   ____________________________________________  EKG  ED ECG REPORT I, Naaman Plummer, the attending physician, personally viewed and interpreted this ECG.  Date: 03/08/2021 EKG Time: 0750 Rate: 73 Rhythm: normal sinus rhythm QRS Axis: normal Intervals: normal ST/T Wave abnormalities: normal Narrative Interpretation: no evidence of acute ischemia  ____________________________________________  RADIOLOGY  ED MD interpretation: CT of the head without contrast shows no evidence of acute abnormalities including no intracerebral hemorrhage, obvious masses, or significant edema  Official radiology report(s): CT HEAD WO CONTRAST  Result Date: 03/08/2021 CLINICAL DATA:  Mental status change.  Dizziness. EXAM: CT HEAD WITHOUT CONTRAST TECHNIQUE: Contiguous axial images were obtained from the base of the skull through the vertex without intravenous contrast. COMPARISON:  05/12/2017 FINDINGS: Brain: No evidence of acute infarction, hemorrhage, hydrocephalus, extra-axial collection or mass lesion/mass effect. There is mild diffuse low-attenuation within the subcortical and periventricular white matter compatible with chronic microvascular disease. Vascular: No hyperdense vessel or unexpected calcification. Skull: Previous right frontal craniotomy. Sinuses/Orbits: No acute finding. Other: None. IMPRESSION: 1. No acute intracranial abnormalities. 2. Chronic small vessel ischemic change. Electronically Signed   By: Kerby Moors M.D.   On: 03/08/2021 08:34    ____________________________________________   PROCEDURES  Procedure(s) performed (including Critical Care):  .1-3 Lead EKG Interpretation Performed by: Naaman Plummer,  MD Authorized by: Naaman Plummer, MD     Interpretation: normal     ECG rate:  62   ECG rate assessment: normal     Rhythm: sinus rhythm     Ectopy: none     Conduction: normal     ____________________________________________   INITIAL IMPRESSION / ASSESSMENT AND PLAN / ED COURSE  As part of my medical decision making, I reviewed the following data within the electronic medical record, if available:  Nursing notes reviewed and incorporated, Labs reviewed, EKG interpreted, Old chart reviewed, Radiograph reviewed and Notes from prior ED visits reviewed and incorporated        Presents to the emergency department complaining of high blood pressure. Patient is otherwise asymptomatic without confusion, chest pain, hematuria, or SOB. Endorses nonadherence to antihypertensive regimen DDx: CV, AMI, heart failure, renal infarction or failure or other end organ damage.  Disposition: Discussed with patient their elevated blood pressure and need for close outpatient management of their hypertension. Will provide a prescription for amlodipine 5mg  PO daily and arrange for the patient to follow up in a primary care clinic      ____________________________________________   FINAL CLINICAL IMPRESSION(S) / ED DIAGNOSES  Final diagnoses:  Lightheadedness  Primary hypertension     ED Discharge Orders          Ordered    amLODipine (NORVASC) 5 MG tablet  Daily        03/08/21 1135             Note:  This document was prepared using Dragon voice recognition software and may include unintentional dictation errors.    Naaman Plummer, MD 03/08/21 1136

## 2021-03-08 NOTE — ED Notes (Signed)
Taken to CT.

## 2021-03-08 NOTE — ED Triage Notes (Signed)
Pt comes pov with lightheadedness, dizziness. Pt states it has been going on for about 2 months but this morning when she got up to move around it was worse. States in 2018 she had "blood on the brain" from an aneurysm and had surgery then. Concerned it may be similar. Not on thinners. Neurologically intact otherwise-just some dizziness and unable to "think straight." CBG 69.

## 2021-05-20 DIAGNOSIS — R42 Dizziness and giddiness: Secondary | ICD-10-CM | POA: Diagnosis not present

## 2021-05-20 DIAGNOSIS — G44229 Chronic tension-type headache, not intractable: Secondary | ICD-10-CM | POA: Diagnosis not present

## 2021-05-20 DIAGNOSIS — Z8679 Personal history of other diseases of the circulatory system: Secondary | ICD-10-CM | POA: Diagnosis not present

## 2021-10-21 DIAGNOSIS — H40003 Preglaucoma, unspecified, bilateral: Secondary | ICD-10-CM | POA: Diagnosis not present

## 2021-10-21 DIAGNOSIS — H43811 Vitreous degeneration, right eye: Secondary | ICD-10-CM | POA: Diagnosis not present

## 2021-10-21 DIAGNOSIS — H2513 Age-related nuclear cataract, bilateral: Secondary | ICD-10-CM | POA: Diagnosis not present

## 2021-11-18 DIAGNOSIS — R42 Dizziness and giddiness: Secondary | ICD-10-CM | POA: Diagnosis not present

## 2021-11-18 DIAGNOSIS — E7800 Pure hypercholesterolemia, unspecified: Secondary | ICD-10-CM | POA: Diagnosis not present

## 2021-11-18 DIAGNOSIS — R519 Headache, unspecified: Secondary | ICD-10-CM | POA: Diagnosis not present

## 2021-11-18 DIAGNOSIS — I10 Essential (primary) hypertension: Secondary | ICD-10-CM | POA: Diagnosis not present

## 2021-11-18 DIAGNOSIS — J45909 Unspecified asthma, uncomplicated: Secondary | ICD-10-CM | POA: Diagnosis not present

## 2021-11-18 DIAGNOSIS — Z1331 Encounter for screening for depression: Secondary | ICD-10-CM | POA: Diagnosis not present

## 2021-11-24 ENCOUNTER — Encounter: Payer: Self-pay | Admitting: Neurology

## 2022-01-27 DIAGNOSIS — C44519 Basal cell carcinoma of skin of other part of trunk: Secondary | ICD-10-CM | POA: Diagnosis not present

## 2022-01-27 DIAGNOSIS — C44319 Basal cell carcinoma of skin of other parts of face: Secondary | ICD-10-CM | POA: Diagnosis not present

## 2022-01-27 DIAGNOSIS — L814 Other melanin hyperpigmentation: Secondary | ICD-10-CM | POA: Diagnosis not present

## 2022-01-27 DIAGNOSIS — L578 Other skin changes due to chronic exposure to nonionizing radiation: Secondary | ICD-10-CM | POA: Diagnosis not present

## 2022-02-09 DIAGNOSIS — E669 Obesity, unspecified: Secondary | ICD-10-CM | POA: Diagnosis not present

## 2022-02-09 DIAGNOSIS — I10 Essential (primary) hypertension: Secondary | ICD-10-CM | POA: Diagnosis not present

## 2022-02-09 DIAGNOSIS — M170 Bilateral primary osteoarthritis of knee: Secondary | ICD-10-CM | POA: Diagnosis not present

## 2022-03-03 DIAGNOSIS — C441121 Basal cell carcinoma of skin of right upper eyelid, including canthus: Secondary | ICD-10-CM | POA: Diagnosis not present

## 2022-03-16 DIAGNOSIS — Z1231 Encounter for screening mammogram for malignant neoplasm of breast: Secondary | ICD-10-CM | POA: Diagnosis not present

## 2022-03-17 DIAGNOSIS — C44319 Basal cell carcinoma of skin of other parts of face: Secondary | ICD-10-CM | POA: Diagnosis not present

## 2022-03-31 DIAGNOSIS — C44519 Basal cell carcinoma of skin of other part of trunk: Secondary | ICD-10-CM | POA: Diagnosis not present

## 2022-04-22 NOTE — Progress Notes (Signed)
NEUROLOGY CONSULTATION NOTE  Christina Carter MRN: [id number] DOB: 11/06/1960  Referring provider: Cyndi Bender, PA-C Primary care provider: Cyndi Bender, PA-C  Reason for consult:  headache  Assessment/Plan:   Chronic tension type headache History of subdural hematoma  1  Start zonisamide '25mg'$  daily for one week, then increase to '50mg'$  daily 2  Limit use of pain relievers to no more than 2 days out of week to prevent risk of rebound or medication-overuse headache. 3  Keep headache diary 4  Follow up 4-5 months.   Subjective:  Analy Carter is a 61 year old right-handed female with OA, hypercholesterolemia, mild intermittent asthma and HTN who presents for headaches.  History supplemented by prior neurologist's and referring provider's notes.  On 05/12/2017, she was admitted to [facility] for right subdural hematoma presenting as  several months history of right sided headache and altered vision that progressively got worse.  CT head showed acute and chronic large right subdural hematoma with 12 mm right to left shift.  CTV of head was negative for venous sinus thrombosis.  Underwent craniotomy and burr hole procedure for evacuation.  Headaches subsequently resolved.  In 2022, she began experiencing intermittent dizziness and recurrent episodes of syncope.  At that same time, she began experiencing frequent headaches.  Mostly right sided but would travel to involve various locations on her head, a mild pressure sensation.  No nausea, vomiting, photophobia, phonophobia, visual disturbance, speech disturbance, dizziness, numbness or weakness.  , CT head on 03/08/2021 personally reviewed revealed mild chronic small vessel ischemic changes in the subcortical and periventricular white matter but no acute findings.  She saw neurology in December 2022 and was started on nortriptyline but she discontinued it due to side effects.  She continues to have a persistent daily headache.  She does not take any pain  relievers, maybe an Advil every once in awhile.    Past NSAIDS/analgesics:  Tylenol Past abortive triptans:  none Past abortive ergotamine:  none Past muscle relaxants:  none Past anti-emetic:  Zofran Past antihypertensive medications:  none Past antidepressant medications:  nortriptyline '20mg'$  (side effects) Past anticonvulsant medications:  Keppra (briefly as prophylaxis following SDH and surgery) Past anti-CGRP:  none Past vitamins/Herbal/Supplements:  none   Current NSAIDS/analgesics:  Advil Current triptans:  none Current ergotamine:  none Current anti-emetic:  none Current muscle relaxants:  none Current Antihypertensive medications:  amlodipine '10mg'$  daily Current Antidepressant medications:  none Current Anticonvulsant medications:  none Current anti-CGRP:  none Current Vitamins/Herbal/Supplements:  Centrum silver, C Current Antihistamines/Decongestants:  none       PAST MEDICAL HISTORY: Past Medical History:  Diagnosis Date   Osteoarthritis     PAST SURGICAL HISTORY: No past surgical history on file.  MEDICATIONS: Current Outpatient Medications on File Prior to Visit  Medication Sig Dispense Refill   amLODipine (NORVASC) 5 MG tablet Take 1 tablet (5 mg total) by mouth daily for 7 days. 7 tablet 0   No current facility-administered medications on file prior to visit.    ALLERGIES: Allergies  Allergen Reactions   Codeine Nausea And Vomiting    FAMILY HISTORY: No family history on file.  Objective:  Blood pressure (!) 154/92, pulse 71, height '5\' 4"'$  (1.626 m), weight 203 lb (92.1 kg), SpO2 99 %. General: No acute distress.  Patient appears well-groomed.   Head:  Normocephalic/atraumatic Eyes:  fundi examined but not visualized Neck: supple, no paraspinal tenderness, full range of motion Back: No paraspinal tenderness Heart: regular rate and rhythm Lungs: Clear  to auscultation bilaterally. Vascular: No carotid bruits. Neurological Exam: Mental  status: alert and oriented to person, place, and time, speech fluent and not dysarthric, language intact. Cranial nerves: CN I: not tested CN II: pupils equal, round and reactive to light, visual fields intact CN III, IV, VI:  full range of motion, no nystagmus, no ptosis CN V: facial sensation intact. CN VII: upper and lower face symmetric CN VIII: hearing intact CN IX, X: gag intact, uvula midline CN XI: sternocleidomastoid and trapezius muscles intact CN XII: tongue midline Bulk & Tone: normal, no fasciculations. Motor:  muscle strength 5/5 throughout Sensation:  Pinprick, temperature and vibratory sensation intact. Deep Tendon Reflexes:  2+ throughout,  toes downgoing.   Finger to nose testing:  Without dysmetria.   Heel to shin:  Without dysmetria.   Gait:  Normal station and stride.  Romberg negative.    Thank you for allowing me to take part in the care of this patient.  Christina Clines, DO

## 2022-04-23 ENCOUNTER — Encounter: Payer: Self-pay | Admitting: Neurology

## 2022-04-23 ENCOUNTER — Ambulatory Visit (INDEPENDENT_AMBULATORY_CARE_PROVIDER_SITE_OTHER): Payer: Medicare HMO | Admitting: Neurology

## 2022-04-23 VITALS — BP 154/92 | HR 71 | Ht 64.0 in | Wt 203.0 lb

## 2022-04-23 DIAGNOSIS — G44229 Chronic tension-type headache, not intractable: Secondary | ICD-10-CM | POA: Diagnosis not present

## 2022-04-23 DIAGNOSIS — Z8679 Personal history of other diseases of the circulatory system: Secondary | ICD-10-CM | POA: Diagnosis not present

## 2022-04-23 MED ORDER — ZONISAMIDE 25 MG PO CAPS: ORAL_CAPSULE | ORAL | 0 refills | Status: DC

## 2022-04-23 NOTE — Patient Instructions (Signed)
Start zonisamide - take 1 pill daily for one week, then increase to 2 pills daily.  Contact me for refill and update on the headaches Limit use of pain relievers to no more than 2 days out of week to prevent risk of rebound or medication-overuse headache. Keep headache diary Follow up 4-5 months.

## 2022-05-25 DIAGNOSIS — I10 Essential (primary) hypertension: Secondary | ICD-10-CM | POA: Diagnosis not present

## 2022-05-25 DIAGNOSIS — J45909 Unspecified asthma, uncomplicated: Secondary | ICD-10-CM | POA: Diagnosis not present

## 2022-05-25 DIAGNOSIS — E7800 Pure hypercholesterolemia, unspecified: Secondary | ICD-10-CM | POA: Diagnosis not present

## 2022-05-25 DIAGNOSIS — R519 Headache, unspecified: Secondary | ICD-10-CM | POA: Diagnosis not present

## 2022-05-26 ENCOUNTER — Encounter: Payer: Self-pay | Admitting: Neurology

## 2022-05-27 ENCOUNTER — Telehealth: Payer: Self-pay

## 2022-05-27 NOTE — Telephone Encounter (Addendum)
Patient states she was prescribed zonisamide (ZONEGRAN) 25 MG capsule and has not noticed a difference, that it isnt helping. Wondering if there is another medication or if it will take time for to start working. Patient has 2 pills left to last.

## 2022-05-28 MED ORDER — ZONISAMIDE 25 MG PO CAPS: ORAL_CAPSULE | ORAL | 0 refills | Status: DC

## 2022-05-28 NOTE — Telephone Encounter (Signed)
Patient advised of Dr.jaffe note,  She should currently be taking two capsules daily.  I would have her increase zonisamide '25mg'$  capsule  to 3 capsules daily for one week, then 4 capsules daily (quantity 120, refills 0).  After discussing instructions with patient, please send prescription to her pharmacy.  Zonisamide 25 mg capsule  to 3 capsules daily for one week, then 4 capsules daily

## 2022-08-09 DIAGNOSIS — K219 Gastro-esophageal reflux disease without esophagitis: Secondary | ICD-10-CM | POA: Diagnosis not present

## 2022-08-09 DIAGNOSIS — R1310 Dysphagia, unspecified: Secondary | ICD-10-CM | POA: Diagnosis not present

## 2022-08-19 ENCOUNTER — Ambulatory Visit: Payer: Disability Insurance | Admitting: Neurology

## 2022-08-31 ENCOUNTER — Telehealth: Payer: Self-pay | Admitting: Gastroenterology

## 2022-08-31 NOTE — Telephone Encounter (Signed)
Pt will be scheduled when June sched opens

## 2022-09-22 ENCOUNTER — Ambulatory Visit: Payer: Disability Insurance | Admitting: Neurology

## 2022-11-03 DIAGNOSIS — M7989 Other specified soft tissue disorders: Secondary | ICD-10-CM | POA: Diagnosis not present

## 2022-11-03 DIAGNOSIS — I10 Essential (primary) hypertension: Secondary | ICD-10-CM | POA: Diagnosis not present

## 2022-11-03 DIAGNOSIS — L723 Sebaceous cyst: Secondary | ICD-10-CM | POA: Diagnosis not present

## 2022-11-03 DIAGNOSIS — L089 Local infection of the skin and subcutaneous tissue, unspecified: Secondary | ICD-10-CM | POA: Diagnosis not present

## 2022-11-09 DIAGNOSIS — L720 Epidermal cyst: Secondary | ICD-10-CM | POA: Diagnosis not present

## 2022-11-09 DIAGNOSIS — L02214 Cutaneous abscess of groin: Secondary | ICD-10-CM | POA: Diagnosis not present

## 2022-11-24 DIAGNOSIS — E7800 Pure hypercholesterolemia, unspecified: Secondary | ICD-10-CM | POA: Diagnosis not present

## 2022-11-24 DIAGNOSIS — Z1331 Encounter for screening for depression: Secondary | ICD-10-CM | POA: Diagnosis not present

## 2022-11-24 DIAGNOSIS — J45909 Unspecified asthma, uncomplicated: Secondary | ICD-10-CM | POA: Diagnosis not present

## 2022-11-24 DIAGNOSIS — I10 Essential (primary) hypertension: Secondary | ICD-10-CM | POA: Diagnosis not present

## 2022-12-02 ENCOUNTER — Encounter: Payer: Self-pay | Admitting: Gastroenterology

## 2022-12-02 ENCOUNTER — Ambulatory Visit: Payer: Medicare HMO | Admitting: Gastroenterology

## 2022-12-02 VITALS — BP 138/88 | HR 68 | Temp 98.2°F | Ht 64.0 in | Wt 200.0 lb

## 2022-12-02 DIAGNOSIS — R1310 Dysphagia, unspecified: Secondary | ICD-10-CM

## 2022-12-02 NOTE — Progress Notes (Signed)
Gastroenterology Consultation  Referring Provider:     [facility] Primary Care Physician:  P[facility], [facility] Primary Gastroenterologist:  Dr. Servando Snare     Reason for Consultation:     Dysphagia        HPI:   Christina Carter is a 62 y.o. y/o female referred for consultation & management of dysphagia by Dr. Lindalou Hose, [facility].  This patient comes in today with a history of dysphagia.  She states that the dysphagia is mostly to solids and usually she can identify the foods like bread and chicken at have caused her problems including at times peanut butter.  She also reports that cucumbers seem to set her off also.  She states that she tries to drink after she has these foods getting stuck and then the water will come up without going down.  The patient denies any unexplained weight loss but states that she lost over 100 pounds by watching her diet but has gained some of it back.  There is no report of any black stools or bloody stools.  She also denies any abdominal pain.  The patient also denies any hematemesis.  Past Medical History:  Diagnosis Date   Osteoarthritis     No past surgical history on file.  Prior to Admission medications   Medication Sig Start Date End Date Taking? Authorizing Provider  zonisamide (ZONEGRAN) 25 MG capsule Take 3 capsules daily for one week, then 4 capsules daily 05/28/22   Everlena Cooper, Adam R, DO  albuterol (VENTOLIN HFA) 108 (90 Base) MCG/ACT inhaler Inhale 2 puffs into the lungs every 6 (six) hours as needed for wheezing or shortness of breath.    [provider]  amLODipine (NORVASC) 5 MG tablet Take 1 tablet (5 mg total) by mouth daily for 7 days. Patient taking differently: Take 2.5 mg by mouth daily. 03/08/21 04/23/22  Merwyn Katos, MD  Ascorbic Acid (VITAMIN C) 100 MG tablet Take 100 mg by mouth daily.    [provider]  Multiple Vitamins-Minerals (MULTIVITAMIN WITH MINERALS) tablet Take 1 tablet by  mouth daily.    [provider]    No family history on file.   Social History   Tobacco Use   Smoking status: Never   Smokeless tobacco: Never  Vaping Use   Vaping Use: Never used  Substance Use Topics   Alcohol use: Never   Drug use: Never    Allergies as of 12/02/2022 - Review Complete 04/23/2022  Allergen Reaction Noted   Codeine Nausea And Vomiting 08/27/2014    Review of Systems:    All systems reviewed and negative except where noted in HPI.   Physical Exam:  There were no vitals taken for this visit. No LMP recorded. Patient is postmenopausal. General:   Alert,  Well-developed, well-nourished, pleasant and cooperative in NAD Head:  Normocephalic and atraumatic. Eyes:  Sclera clear, no icterus.   Conjunctiva pink. Ears:  Normal auditory acuity. Neck:  Supple; no masses or thyromegaly. Lungs:  Respirations even and unlabored.  Clear throughout to auscultation.   No wheezes, crackles, or rhonchi. No acute distress. Heart:  Regular rate and rhythm; no murmurs, clicks, rubs, or gallops. Abdomen:  Normal bowel sounds.  No bruits.  Soft, non-tender and non-distended without masses, hepatosplenomegaly or hernias noted.  No guarding or rebound tenderness.  Negative Carnett sign.   Rectal:  Deferred.  Pulses:  Normal pulses noted. Extremities:  No clubbing or edema.  No cyanosis. Neurologic:  Alert and oriented x3;  grossly normal neurologically. Skin:  Intact without significant lesions or rashes.  No jaundice. Lymph Nodes:  No significant cervical adenopathy. Psych:  Alert and cooperative. Normal mood and affect.  Imaging Studies: No results found.  Assessment and Plan:   Christina Carter is a 62 y.o. y/o female who comes in today with dysphagia.  The patient has had dysphagia to solids more than liquids.  The patient has been explained the pathophysiology of dysphagia including esophageal motility disorders or strictures.  Due to her symptoms being more  consistent with a stricture the patient will be set up for a upper endoscopy.  The patient has been explained the plan and agrees with it.    Midge Minium, MD. Clementeen Graham    Note: This dictation was prepared with Dragon dictation along with smaller phrase technology. Any transcriptional errors that result from this process are unintentional.

## 2022-12-03 ENCOUNTER — Telehealth: Payer: Self-pay

## 2022-12-03 NOTE — Telephone Encounter (Signed)
I saw this patient yesterday and did not address whether or not she has had a colonoscopy in the past. Can you please find out if she has had a colon cancer screening test in the past and if she is due for another. Thank you

## 2023-01-19 ENCOUNTER — Encounter: Payer: Self-pay | Admitting: Gastroenterology

## 2023-01-24 ENCOUNTER — Encounter: Payer: Self-pay | Admitting: Gastroenterology

## 2023-01-25 ENCOUNTER — Ambulatory Visit
Admission: RE | Admit: 2023-01-25 | Discharge: 2023-01-25 | Disposition: A | Discharge status: Home / Self Care | Payer: Medicare HMO | Attending: Gastroenterology | Admitting: Gastroenterology

## 2023-01-25 ENCOUNTER — Ambulatory Visit: Payer: Medicare HMO | Admitting: Anesthesiology

## 2023-01-25 ENCOUNTER — Encounter: Payer: Self-pay | Admitting: Gastroenterology

## 2023-01-25 ENCOUNTER — Encounter
Admission: RE | Disposition: A | Discharge status: Home / Self Care | Payer: Self-pay | Source: Home / Self Care | Attending: Gastroenterology

## 2023-01-25 DIAGNOSIS — R1310 Dysphagia, unspecified: Secondary | ICD-10-CM | POA: Diagnosis not present

## 2023-01-25 DIAGNOSIS — Z87891 Personal history of nicotine dependence: Secondary | ICD-10-CM | POA: Insufficient documentation

## 2023-01-25 DIAGNOSIS — K449 Diaphragmatic hernia without obstruction or gangrene: Secondary | ICD-10-CM | POA: Insufficient documentation

## 2023-01-25 DIAGNOSIS — K2281 Esophageal polyp: Secondary | ICD-10-CM | POA: Diagnosis not present

## 2023-01-25 DIAGNOSIS — K2282 Esophagogastric junction polyp: Secondary | ICD-10-CM | POA: Diagnosis not present

## 2023-01-25 DIAGNOSIS — R6889 Other general symptoms and signs: Secondary | ICD-10-CM | POA: Diagnosis not present

## 2023-01-25 DIAGNOSIS — K222 Esophageal obstruction: Secondary | ICD-10-CM | POA: Diagnosis not present

## 2023-01-25 DIAGNOSIS — I10 Essential (primary) hypertension: Secondary | ICD-10-CM | POA: Insufficient documentation

## 2023-01-25 HISTORY — PX: ESOPHAGOGASTRODUODENOSCOPY (EGD) WITH PROPOFOL: SHX5813

## 2023-01-25 HISTORY — PX: BIOPSY: SHX5522

## 2023-01-25 HISTORY — PX: BALLOON DILATION: SHX5330

## 2023-01-25 SURGERY — ESOPHAGOGASTRODUODENOSCOPY (EGD) WITH PROPOFOL
Anesthesia: General

## 2023-01-25 MED ORDER — LIDOCAINE HCL (CARDIAC) PF 100 MG/5ML IV SOSY
PREFILLED_SYRINGE | INTRAVENOUS | Status: DC | PRN
  Administered 2023-01-25: 40 mg via INTRAVENOUS

## 2023-01-25 MED ORDER — SODIUM CHLORIDE 0.9 % IV SOLN: INTRAVENOUS | Status: DC

## 2023-01-25 MED ORDER — PROPOFOL 10 MG/ML IV BOLUS
INTRAVENOUS | Status: DC | PRN
Start: 2023-01-25 — End: 2023-01-25
  Administered 2023-01-25: 50 mg via INTRAVENOUS
  Administered 2023-01-25: 100 mg via INTRAVENOUS
  Administered 2023-01-25: 50 mg via INTRAVENOUS

## 2023-01-25 NOTE — Op Note (Signed)
[facility] Gastroenterology Patient Name: Christina Carter Procedure Date: 01/25/2023 11:03 AM MRN: [id number] Account #: [id number] Date of Birth: [birth year] Admit Type: Outpatient Age: 62 Room: [facility] ENDO ROOM 4 Gender: Female Note Status: Finalized Instrument Name: Upper Endoscope [id number] Procedure:             Upper GI endoscopy Indications:           Dysphagia Providers:             Midge Minium MD, MD Referring MD:          Lonie Peak (Referring MD) Medicines:             Propofol per Anesthesia Complications:         No immediate complications. Procedure:             Pre-Anesthesia Assessment:                        - Prior to the procedure, a History and Physical was                         performed, and patient medications and allergies were                         reviewed. The patient's tolerance of previous                         anesthesia was also reviewed. The risks and benefits                         of the procedure and the sedation options and risks                         were discussed with the patient. All questions were                         answered, and informed consent was obtained. Prior                         Anticoagulants: The patient has taken no anticoagulant                         or antiplatelet agents. ASA Grade Assessment: II - A                         patient with mild systemic disease. After reviewing                         the risks and benefits, the patient was deemed in                         satisfactory condition to undergo the procedure.                        After obtaining informed consent, the endoscope was                         passed under direct vision. Throughout the procedure,  the patient's blood pressure, pulse, and oxygen                         saturations were monitored continuously. The                         Endosonoscope was introduced through the mouth, and                          advanced to the second part of duodenum. The upper GI                         endoscopy was accomplished without difficulty. The                         patient tolerated the procedure well. Findings:      A small hiatal hernia was present.      One benign-appearing, intrinsic moderate stenosis was found at the       gastroesophageal junction. This stenosis measured 1.2 cm (inner       diameter). The stenosis was traversed. A TTS dilator was passed through       the scope. Dilation with a 12-13.5-15 mm balloon dilator was performed       to 15 mm. The dilation site was examined following endoscope reinsertion       and showed moderate improvement in luminal narrowing.      A single polyp with no bleeding was found at the gastroesophageal       junction. Biopsies were taken with a cold forceps for histology.      The stomach was normal.      The examined duodenum was normal. Impression:            - Small hiatal hernia.                        - Benign-appearing esophageal stenosis. Dilated.                        - Gastroesophageal junction polyp(s) were found.                         Biopsied.                        - Normal stomach.                        - Normal examined duodenum. Recommendation:        - Discharge patient to home.                        - Resume previous diet.                        - Continue present medications.                        - Await pathology results.                        - Repeat upper endoscopy in 6 weeks for retreatment. Procedure Code(s):     ---  Professional ---                        [phone], Esophagogastroduodenoscopy, flexible,                         transoral; with transendoscopic balloon dilation of                         esophagus (less than 30 mm diameter)                        43239, 59, Esophagogastroduodenoscopy, flexible,                         transoral; with biopsy, single or multiple Diagnosis Code(s):     ---  Professional ---                        R13.10, Dysphagia, unspecified                        K22.2, Esophageal obstruction                        K22.82, Esophagogastric junction polyp CPT copyright 2022 American Medical Association. All rights reserved. The codes documented in this report are preliminary and upon coder review may  be revised to meet current compliance requirements. Midge Minium MD, MD 01/25/2023 11:23:27 AM This report has been signed electronically. Number of Addenda: 0 Note Initiated On: 01/25/2023 11:03 AM Estimated Blood Loss:  Estimated blood loss: none.      [facility]

## 2023-01-25 NOTE — Anesthesia Procedure Notes (Signed)
Procedure Name: General with mask airway Date/Time: 01/25/2023 11:15 AM  Performed by: Lily Lovings, CRNAPre-anesthesia Checklist: Patient identified, Emergency Drugs available, Suction available, Patient being monitored and Timeout performed Patient Re-evaluated:Patient Re-evaluated prior to induction Oxygen Delivery Method: Simple face mask Preoxygenation: Pre-oxygenation with 100% oxygen Induction Type: IV induction

## 2023-01-25 NOTE — Anesthesia Postprocedure Evaluation (Signed)
Anesthesia Post Note  Patient: Christina Carter  Procedure(s) Performed: ESOPHAGOGASTRODUODENOSCOPY (EGD) WITH PROPOFOL BIOPSY BALLOON DILATION  Patient location during evaluation: Endoscopy Anesthesia Type: General Level of consciousness: awake and alert Pain management: pain level controlled Vital Signs Assessment: post-procedure vital signs reviewed and stable Respiratory status: spontaneous breathing, nonlabored ventilation and respiratory function stable Cardiovascular status: blood pressure returned to baseline and stable Postop Assessment: no apparent nausea or vomiting Anesthetic complications: no   No notable events documented.   Last Vitals:  Vitals:   01/25/23 1125 01/25/23 1135  BP: 131/84 (!) 137/91  Pulse: 74 72  Resp: (!) 23 15  Temp:    SpO2: 97% 97%    Last Pain:  Vitals:   01/25/23 1135  TempSrc:   PainSc: 0-No pain                 Foye Deer

## 2023-01-25 NOTE — Progress Notes (Addendum)
Patient arrived for her scheduled EGD with Dr Servando Snare 01-25-23, but she did not have anyone with her to ride home with her and be available after her procedure.  The office will call with a reschedule date.  Update:    Prior to the patient leaving the hospital, she was able to secure a person to provide her transportation home and be available after the procedure and the procedure will be performed as scheduled.

## 2023-01-25 NOTE — Transfer of Care (Signed)
Immediate Anesthesia Transfer of Care Note  Patient: Christina Carter  Procedure(s) Performed: ESOPHAGOGASTRODUODENOSCOPY (EGD) WITH PROPOFOL BIOPSY BALLOON ENTEROSCOPY  Patient Location: PACU and Endoscopy Unit  Anesthesia Type:General  Level of Consciousness: drowsy and patient cooperative  Airway & Oxygen Therapy: Patient Spontanous Breathing and Patient connected to face mask oxygen  Post-op Assessment: Report given to RN and Patient moving all extremities  Post vital signs: Reviewed and stable  Last Vitals:  Vitals Value Taken Time  BP 131/84 01/25/23 1125  Temp    Pulse 74 01/25/23 1125  Resp 23 01/25/23 1125  SpO2 97 % 01/25/23 1125    Last Pain:  Vitals:   01/25/23 1125  TempSrc:   PainSc: 0-No pain         Complications: No notable events documented.

## 2023-01-25 NOTE — Anesthesia Preprocedure Evaluation (Signed)
Anesthesia Evaluation  Patient identified by MRN, date of birth, ID band Patient awake    Reviewed: Allergy & Precautions, H&P , NPO status , Patient's Chart, lab work & pertinent test results  Airway Mallampati: I  TM Distance: >3 FB Neck ROM: full    Dental no notable dental hx.    Pulmonary neg pulmonary ROS, former smoker   Pulmonary exam normal        Cardiovascular hypertension, Normal cardiovascular exam     Neuro/Psych History of subdural hematoma  negative psych ROS   GI/Hepatic negative GI ROS, Neg liver ROS,,,  Endo/Other  negative endocrine ROS    Renal/GU negative Renal ROS  negative genitourinary   Musculoskeletal   Abdominal  (+) + obese  Peds  Hematology negative hematology ROS (+)   Anesthesia Other Findings Past Medical History: No date: Brain aneurysm No date: Hypertension No date: Osteoarthritis  Past Surgical History: No date: APPENDECTOMY No date: CHOLECYSTECTOMY No date: COLONOSCOPY No date: JOINT REPLACEMENT No date: REPLACEMENT TOTAL KNEE BILATERAL  BMI    Body Mass Index: 36.07 kg/m      Reproductive/Obstetrics negative OB ROS                             Anesthesia Physical Anesthesia Plan  ASA: 2  Anesthesia Plan: General   Post-op Pain Management: Minimal or no pain anticipated   Induction: Intravenous  PONV Risk Score and Plan: Propofol infusion and TIVA  Airway Management Planned: Natural Airway  Additional Equipment:   Intra-op Plan:   Post-operative Plan:   Informed Consent: I have reviewed the patients History and Physical, chart, labs and discussed the procedure including the risks, benefits and alternatives for the proposed anesthesia with the patient or authorized representative who has indicated his/her understanding and acceptance.     Dental Advisory Given  Plan Discussed with: CRNA and Surgeon  Anesthesia Plan  Comments:         Anesthesia Quick Evaluation

## 2023-01-25 NOTE — H&P (Signed)
Midge Minium, MD [facility] [address], [address] [place], [place] [address] [phone] Fax : [phone]  Primary Care Physician:  Lonie Peak, PA-C Primary Gastroenterologist:  Dr. Servando Snare  Pre-Procedure History & Physical: HPI:  Christina Carter is a 62 y.o. female is here for an endoscopy.   Past Medical History:  Diagnosis Date   Brain aneurysm    Hypertension    Osteoarthritis     Past Surgical History:  Procedure Laterality Date   APPENDECTOMY     CHOLECYSTECTOMY     COLONOSCOPY     JOINT REPLACEMENT     REPLACEMENT TOTAL KNEE BILATERAL      Prior to Admission medications   Medication Sig Start Date End Date Taking? Authorizing Provider  amLODipine (NORVASC) 5 MG tablet Take 1 tablet (5 mg total) by mouth daily for 7 days. Patient taking differently: Take 2.5 mg by mouth daily. 03/08/21 01/25/23 Yes Bradler, Clent Jacks, MD  albuterol (VENTOLIN HFA) 108 (90 Base) MCG/ACT inhaler Inhale 2 puffs into the lungs every 6 (six) hours as needed for wheezing or shortness of breath.    [provider]  Ascorbic Acid (VITAMIN C) 100 MG tablet Take 100 mg by mouth daily.    [provider]  Multiple Vitamins-Minerals (MULTIVITAMIN WITH MINERALS) tablet Take 1 tablet by mouth daily.    [provider]    Allergies as of 12/02/2022 - Review Complete 12/02/2022  Allergen Reaction Noted   Codeine Nausea And Vomiting 08/27/2014    History reviewed. No pertinent family history.  Social History   Socioeconomic History   Marital status: Widowed    Spouse name: Not on file   Number of children: Not on file   Years of education: Not on file   Highest education level: Not on file  Occupational History   Not on file  Tobacco Use   Smoking status: Former    Current packs/day: 0.00    Types: Cigarettes    Quit date: 2013    Years since quitting: 11.6   Smokeless tobacco: Never  Vaping Use   Vaping status: Never Used  Substance and Sexual Activity    Alcohol use: Never   Drug use: Never   Sexual activity: Not on file  Other Topics Concern   Not on file  Social History Narrative   Are you right handed or left handed? Right handed   Are you currently employed ? Retired    What is your current occupation? na   Do you live at home alone? alone   Who lives with you? na          Social Determinants of Health   Financial Resource Strain: Not on file  Food Insecurity: Not on file  Transportation Needs: Not on file  Physical Activity: Not on file  Stress: Not on file  Social Connections: Not on file  Intimate Partner Violence: Not on file    Review of Systems: See HPI, otherwise negative ROS  Physical Exam: BP (!) 163/96   Pulse 70   Temp 97.8 F (36.6 C) (Temporal)   Resp 18   Ht 5\' 3"  (1.6 m)   Wt 92.4 kg   SpO2 97%   BMI 36.07 kg/m  General:   Alert,  pleasant and cooperative in NAD Head:  Normocephalic and atraumatic. Neck:  Supple; no masses or thyromegaly. Lungs:  Clear throughout to auscultation.    Heart:  Regular rate and rhythm. Abdomen:  Soft, nontender and nondistended. Normal bowel sounds,  without guarding, and without rebound.   Neurologic:  Alert and  oriented x4;  grossly normal neurologically.  Impression/Plan: Christina Carter is here for an endoscopy to be performed for dysphagia  Risks, benefits, limitations, and alternatives regarding  endoscopy have been reviewed with the patient.  Questions have been answered.  All parties agreeable.   Midge Minium, MD  01/25/2023, 10:39 AM

## 2023-01-26 ENCOUNTER — Encounter: Payer: Self-pay | Admitting: Gastroenterology

## 2023-01-26 ENCOUNTER — Telehealth: Payer: Self-pay

## 2023-01-26 NOTE — Telephone Encounter (Signed)
Patient states Dr. Servando Snare told her Yesterday during her EGD that she needing a repeat EGD in 6 weeks. She is wanting to go on and get this schedule

## 2023-01-27 ENCOUNTER — Other Ambulatory Visit: Payer: Self-pay

## 2023-01-27 DIAGNOSIS — K222 Esophageal obstruction: Secondary | ICD-10-CM

## 2023-01-31 ENCOUNTER — Encounter: Payer: Self-pay | Admitting: Gastroenterology

## 2023-02-17 ENCOUNTER — Telehealth: Payer: Self-pay

## 2023-02-17 NOTE — Telephone Encounter (Signed)
Patient has a EGD schedule for 03/10/2023 with dr. Servando Snare she states she knows she is supposed to stop some medications before her procedures and has recently started taking Phentermine. She wants to know if she needs to stop taking this medication and if so how many days does she need to stop it for.

## 2023-02-20 DIAGNOSIS — L237 Allergic contact dermatitis due to plants, except food: Secondary | ICD-10-CM | POA: Diagnosis not present

## 2023-03-10 ENCOUNTER — Encounter: Payer: Self-pay | Admitting: Gastroenterology

## 2023-03-10 ENCOUNTER — Ambulatory Visit
Admission: RE | Admit: 2023-03-10 | Discharge: 2023-03-10 | Disposition: A | Discharge status: Home / Self Care | Payer: Medicare HMO | Attending: Gastroenterology | Admitting: Gastroenterology

## 2023-03-10 ENCOUNTER — Ambulatory Visit: Payer: Medicare HMO | Admitting: Certified Registered Nurse Anesthetist

## 2023-03-10 ENCOUNTER — Encounter
Admission: RE | Disposition: A | Discharge status: Home / Self Care | Payer: Self-pay | Source: Home / Self Care | Attending: Gastroenterology

## 2023-03-10 DIAGNOSIS — I10 Essential (primary) hypertension: Secondary | ICD-10-CM | POA: Diagnosis not present

## 2023-03-10 DIAGNOSIS — M1990 Unspecified osteoarthritis, unspecified site: Secondary | ICD-10-CM | POA: Insufficient documentation

## 2023-03-10 DIAGNOSIS — K449 Diaphragmatic hernia without obstruction or gangrene: Secondary | ICD-10-CM | POA: Insufficient documentation

## 2023-03-10 DIAGNOSIS — K222 Esophageal obstruction: Secondary | ICD-10-CM | POA: Diagnosis not present

## 2023-03-10 DIAGNOSIS — R1310 Dysphagia, unspecified: Secondary | ICD-10-CM | POA: Diagnosis not present

## 2023-03-10 DIAGNOSIS — Z87891 Personal history of nicotine dependence: Secondary | ICD-10-CM | POA: Insufficient documentation

## 2023-03-10 DIAGNOSIS — Z79899 Other long term (current) drug therapy: Secondary | ICD-10-CM | POA: Diagnosis not present

## 2023-03-10 HISTORY — PX: BALLOON DILATION: SHX5330

## 2023-03-10 HISTORY — PX: ESOPHAGOGASTRODUODENOSCOPY (EGD) WITH PROPOFOL: SHX5813

## 2023-03-10 SURGERY — ESOPHAGOGASTRODUODENOSCOPY (EGD) WITH PROPOFOL
Anesthesia: General

## 2023-03-10 MED ORDER — LIDOCAINE HCL (CARDIAC) PF 100 MG/5ML IV SOSY
PREFILLED_SYRINGE | INTRAVENOUS | Status: DC | PRN
  Administered 2023-03-10: 50 mg via INTRAVENOUS

## 2023-03-10 MED ORDER — PROPOFOL 10 MG/ML IV BOLUS
INTRAVENOUS | Status: DC | PRN
  Administered 2023-03-10: 80 mg via INTRAVENOUS

## 2023-03-10 MED ORDER — LIDOCAINE HCL (PF) 2 % IJ SOLN
INTRAMUSCULAR | Status: AC
  Filled 2023-03-10: qty 5

## 2023-03-10 MED ORDER — PROPOFOL 500 MG/50ML IV EMUL
INTRAVENOUS | Status: DC | PRN
  Administered 2023-03-10: 160 ug/kg/min via INTRAVENOUS

## 2023-03-10 MED ORDER — PROPOFOL 1000 MG/100ML IV EMUL
INTRAVENOUS | Status: AC
  Filled 2023-03-10: qty 100

## 2023-03-10 MED ORDER — SODIUM CHLORIDE 0.9 % IV SOLN: INTRAVENOUS | Status: DC

## 2023-03-10 NOTE — Anesthesia Postprocedure Evaluation (Signed)
Anesthesia Post Note  Patient: Christina Carter  Procedure(s) Performed: ESOPHAGOGASTRODUODENOSCOPY (EGD) WITH PROPOFOL  Patient location during evaluation: PACU Anesthesia Type: General Level of consciousness: awake Pain management: pain level controlled Vital Signs Assessment: post-procedure vital signs reviewed and stable Respiratory status: spontaneous breathing Cardiovascular status: stable Anesthetic complications: no   No notable events documented.   Last Vitals:  Vitals:   03/10/23 0943 03/10/23 0953  BP: 132/82 135/88  Pulse: 71 70  Resp: 15 14  Temp:  (!) 36.1 C  SpO2: 97% 97%    Last Pain:  Vitals:   03/10/23 0953  TempSrc: Temporal  PainSc:                  VAN STAVEREN,Dianna Deshler

## 2023-03-10 NOTE — Op Note (Signed)
[facility] Patient Name: Christina Carter Procedure Date: 03/10/2023 9:21 AM MRN: [id number] Account #: [id number] Date of Birth: 1960/10/20 Admit Type: Outpatient Age: 62 Room: [facility] ENDO ROOM 4 Gender: Female Note Status: Finalized Instrument Name: Patton Salles Endoscope [id number] Procedure:             Upper GI endoscopy Indications:           Dysphagia Providers:             Midge Minium MD, MD Referring MD:          Leretha Dykes. Sharrie Rothman, MD (Referring MD) Medicines:             Propofol per Anesthesia Complications:         No immediate complications. Procedure:             Pre-Anesthesia Assessment:                        - Prior to the procedure, a History and Physical was                         performed, and patient medications and allergies were                         reviewed. The patient's tolerance of previous                         anesthesia was also reviewed. The risks and benefits                         of the procedure and the sedation options and risks                         were discussed with the patient. All questions were                         answered, and informed consent was obtained. Prior                         Anticoagulants: The patient has taken no anticoagulant                         or antiplatelet agents. ASA Grade Assessment: II - A                         patient with mild systemic disease. After reviewing                         the risks and benefits, the patient was deemed in                         satisfactory condition to undergo the procedure.                        After obtaining informed consent, the endoscope was                         passed under direct vision. Throughout the procedure,  the patient's blood pressure, pulse, and oxygen                         saturations were monitored continuously. The Endoscope                         was introduced through the mouth,  and advanced to the                         second part of duodenum. The upper GI endoscopy was                         accomplished without difficulty. The patient tolerated                         the procedure well. Findings:      A medium-sized hiatal hernia was present.      One benign-appearing, intrinsic mild stenosis was found at the       gastroesophageal junction. The stenosis was traversed. A TTS dilator was       passed through the scope. Dilation with a 15-16.5-18 mm balloon dilator       was performed to 18 mm. The dilation site was examined following       endoscope reinsertion and showed complete resolution of luminal       narrowing.      The stomach was normal.      The examined duodenum was normal. Impression:            - Medium-sized hiatal hernia.                        - Benign-appearing esophageal stenosis. Dilated.                        - Normal stomach.                        - Normal examined duodenum.                        - No specimens collected. Recommendation:        - Discharge patient to home.                        - Resume previous diet.                        - Continue present medications. Procedure Code(s):     --- Professional ---                        [phone], Esophagogastroduodenoscopy, flexible,                         transoral; with transendoscopic balloon dilation of                         esophagus (less than 30 mm diameter) Diagnosis Code(s):     --- Professional ---                        R13.10, Dysphagia, unspecified  K22.2, Esophageal obstruction CPT copyright 2022 American [employer] Association. All rights reserved. The codes documented in this report are preliminary and upon coder review may  be revised to meet current compliance requirements. Midge Minium MD, MD 03/10/2023 9:39:23 AM This report has been signed electronically. Number of Addenda: 0 Note Initiated On: 03/10/2023 9:21 AM Estimated Blood Loss:   Estimated blood loss: none.      [facility]

## 2023-03-10 NOTE — H&P (Signed)
Midge Minium, MD [facility] [address] [place], [place] [address] Phone:[phone] Fax : [phone]  Primary Care Physician:  Lonie Peak, PA-C Primary Gastroenterologist:  Dr. Servando Snare  Pre-Procedure History & Physical: HPI:  Christina Carter is a 62 y.o. female is here for an endoscopy.   Past Medical History:  Diagnosis Date   Brain aneurysm    Hypertension    Osteoarthritis     Past Surgical History:  Procedure Laterality Date   APPENDECTOMY     BALLOON DILATION  01/25/2023   Procedure: BALLOON DILATION;  Surgeon: Midge Minium, MD;  Location: [facility] ENDOSCOPY;  Service: Endoscopy;;   BIOPSY  01/25/2023   Procedure: BIOPSY;  Surgeon: Midge Minium, MD;  Location: [facility] ENDOSCOPY;  Service: Endoscopy;;   CHOLECYSTECTOMY     COLONOSCOPY     ESOPHAGOGASTRODUODENOSCOPY (EGD) WITH PROPOFOL N/A 01/25/2023   Procedure: ESOPHAGOGASTRODUODENOSCOPY (EGD) WITH PROPOFOL;  Surgeon: Midge Minium, MD;  Location: [facility] ENDOSCOPY;  Service: Endoscopy;  Laterality: N/A;   JOINT REPLACEMENT     REPLACEMENT TOTAL KNEE BILATERAL      Prior to Admission medications   Medication Sig Start Date End Date Taking? Authorizing Provider  amLODipine (NORVASC) 5 MG tablet Take 1 tablet (5 mg total) by mouth daily for 7 days. Patient taking differently: Take 2.5 mg by mouth daily. 03/08/21 03/10/23 Yes Merwyn Katos, MD  Ascorbic Acid (VITAMIN C) 100 MG tablet Take 100 mg by mouth daily.   Yes [provider]  ibuprofen (ADVIL) 100 MG tablet Take 400 mg by mouth every 6 (six) hours as needed for fever.   Yes [provider]  lisinopril-hydrochlorothiazide (ZESTORETIC) 20-25 MG tablet Take 1 tablet by mouth daily.   Yes [provider]  Multiple Vitamins-Minerals (MULTIVITAMIN WITH MINERALS) tablet Take 1 tablet by mouth daily.   Yes [provider]  albuterol (VENTOLIN HFA) 108 (90 Base) MCG/ACT inhaler Inhale 2 puffs into the lungs every 6 (six) hours as needed for  wheezing or shortness of breath.    [provider]    Allergies as of 01/27/2023 - Review Complete 01/25/2023  Allergen Reaction Noted   Codeine Nausea And Vomiting 08/27/2014    History reviewed. No pertinent family history.  Social History   Socioeconomic History   Marital status: Widowed    Spouse name: Not on file   Number of children: Not on file   Years of education: Not on file   Highest education level: Not on file  Occupational History   Not on file  Tobacco Use   Smoking status: Former    Current packs/day: 0.00    Types: Cigarettes    Quit date: 2013    Years since quitting: 11.7   Smokeless tobacco: Never  Vaping Use   Vaping status: Never Used  Substance and Sexual Activity   Alcohol use: Never   Drug use: Never   Sexual activity: Not on file  Other Topics Concern   Not on file  Social History Narrative   Are you right handed or left handed? Right handed   Are you currently employed ? Retired    What is your current occupation? na   Do you live at home alone? alone   Who lives with you? na          Social Determinants of Health   Financial Resource Strain: Not on file  Food Insecurity: Not on file  Transportation Needs: Not on file  Physical Activity: Not on file  Stress:  Not on file  Social Connections: Not on file  Intimate Partner Violence: Not on file    Review of Systems: See HPI, otherwise negative ROS  Physical Exam: BP (!) 156/102   Pulse 71   Temp 97.6 F (36.4 C) (Temporal)   Resp 16   Ht 5\' 4"  (1.626 m)   Wt 93 kg   SpO2 100%   BMI 35.19 kg/m  General:   Alert,  pleasant and cooperative in NAD Head:  Normocephalic and atraumatic. Neck:  Supple; no masses or thyromegaly. Lungs:  Clear throughout to auscultation.    Heart:  Regular rate and rhythm. Abdomen:  Soft, nontender and nondistended. Normal bowel sounds, without guarding, and without rebound.   Neurologic:  Alert and  oriented x4;  grossly normal  neurologically.  Impression/Plan: Christina Carter is here for an endoscopy to be performed for dysphagia  Risks, benefits, limitations, and alternatives regarding  endoscopy have been reviewed with the patient.  Questions have been answered.  All parties agreeable.   Midge Minium, MD  03/10/2023, 9:26 AM

## 2023-03-10 NOTE — Anesthesia Procedure Notes (Signed)
Procedure Name: MAC Date/Time: 03/10/2023 9:24 AM  Performed by: Hezzie Bump, CRNAPre-anesthesia Checklist: Patient identified, Emergency Drugs available, Suction available and Patient being monitored Patient Re-evaluated:Patient Re-evaluated prior to induction Oxygen Delivery Method: Nasal cannula Induction Type: IV induction Placement Confirmation: positive ETCO2

## 2023-03-10 NOTE — Anesthesia Preprocedure Evaluation (Signed)
Anesthesia Evaluation  Patient identified by MRN, date of birth, ID band Patient awake    Reviewed: Allergy & Precautions, NPO status , Patient's Chart, lab work & pertinent test results  Airway Mallampati: III  TM Distance: >3 FB Neck ROM: full    Dental  (+) Teeth Intact   Pulmonary neg pulmonary ROS, Patient abstained from smoking., former smoker   Pulmonary exam normal  + decreased breath sounds      Cardiovascular Exercise Tolerance: Good hypertension, Pt. on medications negative cardio ROS Normal cardiovascular exam Rhythm:Regular Rate:Normal     Neuro/Psych negative neurological ROS  negative psych ROS   GI/Hepatic negative GI ROS, Neg liver ROS,,,  Endo/Other  negative endocrine ROS    Renal/GU negative Renal ROS  negative genitourinary   Musculoskeletal   Abdominal Normal abdominal exam  (+)   Peds negative pediatric ROS (+)  Hematology negative hematology ROS (+)   Anesthesia Other Findings Past Medical History: No date: Brain aneurysm No date: Hypertension No date: Osteoarthritis  Past Surgical History: No date: APPENDECTOMY 01/25/2023: BALLOON DILATION     Comment:  Procedure: BALLOON DILATION;  Surgeon: Midge Minium, MD;              Location: [facility] ENDOSCOPY;  Service: Endoscopy;; 01/25/2023: BIOPSY     Comment:  Procedure: BIOPSY;  Surgeon: Midge Minium, MD;                Location: [facility] ENDOSCOPY;  Service: Endoscopy;; No date: CHOLECYSTECTOMY No date: COLONOSCOPY 01/25/2023: ESOPHAGOGASTRODUODENOSCOPY (EGD) WITH PROPOFOL; N/A     Comment:  Procedure: ESOPHAGOGASTRODUODENOSCOPY (EGD) WITH               PROPOFOL;  Surgeon: Midge Minium, MD;  Location: [facility]               ENDOSCOPY;  Service: Endoscopy;  Laterality: N/A; No date: JOINT REPLACEMENT No date: REPLACEMENT TOTAL KNEE BILATERAL  BMI    Body Mass Index: 35.19 kg/m      Reproductive/Obstetrics negative OB ROS                              Anesthesia Physical Anesthesia Plan  ASA: 2  Anesthesia Plan: General   Post-op Pain Management:    Induction: Intravenous  PONV Risk Score and Plan: Propofol infusion and TIVA  Airway Management Planned: Natural Airway  Additional Equipment:   Intra-op Plan:   Post-operative Plan:   Informed Consent: I have reviewed the patients History and Physical, chart, labs and discussed the procedure including the risks, benefits and alternatives for the proposed anesthesia with the patient or authorized representative who has indicated his/her understanding and acceptance.     Dental Advisory Given  Plan Discussed with: CRNA and Surgeon  Anesthesia Plan Comments:        Anesthesia Quick Evaluation

## 2023-03-10 NOTE — Transfer of Care (Signed)
Immediate Anesthesia Transfer of Care Note  Patient: Christina Carter  Procedure(s) Performed: ESOPHAGOGASTRODUODENOSCOPY (EGD) WITH PROPOFOL  Patient Location: Endoscopy Unit  Anesthesia Type:General  Level of Consciousness: awake, alert , and oriented  Airway & Oxygen Therapy: Patient Spontanous Breathing  Post-op Assessment: Report given to RN and Post -op Vital signs reviewed and stable  Post vital signs: Reviewed and stable  Last Vitals:  Vitals Value Taken Time  BP    Temp    Pulse    Resp    SpO2      Last Pain:  Vitals:   03/10/23 0848  TempSrc: Temporal  PainSc: 1          Complications: No notable events documented.

## 2023-03-14 ENCOUNTER — Encounter: Payer: Self-pay | Admitting: Gastroenterology

## 2023-03-22 DIAGNOSIS — Z1231 Encounter for screening mammogram for malignant neoplasm of breast: Secondary | ICD-10-CM | POA: Diagnosis not present

## 2023-03-23 DIAGNOSIS — R1310 Dysphagia, unspecified: Secondary | ICD-10-CM | POA: Diagnosis not present

## 2023-03-23 DIAGNOSIS — I10 Essential (primary) hypertension: Secondary | ICD-10-CM | POA: Diagnosis not present

## 2023-03-24 ENCOUNTER — Telehealth: Payer: Self-pay | Admitting: Gastroenterology

## 2023-03-24 ENCOUNTER — Emergency Department
Admission: EM | Admit: 2023-03-24 | Discharge: 2023-03-24 | Disposition: A | Discharge status: Home / Self Care | Payer: Medicare HMO | Attending: Emergency Medicine | Admitting: Emergency Medicine

## 2023-03-24 ENCOUNTER — Telehealth: Payer: Self-pay

## 2023-03-24 ENCOUNTER — Encounter: Payer: Self-pay | Admitting: Gastroenterology

## 2023-03-24 ENCOUNTER — Other Ambulatory Visit: Payer: Self-pay

## 2023-03-24 ENCOUNTER — Emergency Department: Payer: Medicare HMO

## 2023-03-24 DIAGNOSIS — I10 Essential (primary) hypertension: Secondary | ICD-10-CM | POA: Diagnosis not present

## 2023-03-24 DIAGNOSIS — R1310 Dysphagia, unspecified: Secondary | ICD-10-CM | POA: Diagnosis not present

## 2023-03-24 DIAGNOSIS — R0602 Shortness of breath: Secondary | ICD-10-CM | POA: Diagnosis not present

## 2023-03-24 DIAGNOSIS — R221 Localized swelling, mass and lump, neck: Secondary | ICD-10-CM | POA: Insufficient documentation

## 2023-03-24 DIAGNOSIS — J351 Hypertrophy of tonsils: Secondary | ICD-10-CM | POA: Diagnosis not present

## 2023-03-24 LAB — CBC WITH DIFFERENTIAL/PLATELET
Abs Immature Granulocytes: 0.02 10*3/uL (ref 0.00–0.07)
Basophils Absolute: 0 10*3/uL (ref 0.0–0.1)
Basophils Relative: 1 %
Eosinophils Absolute: 0.1 10*3/uL (ref 0.0–0.5)
Eosinophils Relative: 2 %
HCT: 40 % (ref 36.0–46.0)
Hemoglobin: 13.6 g/dL (ref 12.0–15.0)
Immature Granulocytes: 0 %
Lymphocytes Relative: 31 %
Lymphs Abs: 2 10*3/uL (ref 0.7–4.0)
MCH: 32.2 pg (ref 26.0–34.0)
MCHC: 34 g/dL (ref 30.0–36.0)
MCV: 94.8 fL (ref 80.0–100.0)
Monocytes Absolute: 0.6 10*3/uL (ref 0.1–1.0)
Monocytes Relative: 10 %
Neutro Abs: 3.5 10*3/uL (ref 1.7–7.7)
Neutrophils Relative %: 56 %
Platelets: 349 10*3/uL (ref 150–400)
RBC: 4.22 MIL/uL (ref 3.87–5.11)
RDW: 13.1 % (ref 11.5–15.5)
WBC: 6.3 10*3/uL (ref 4.0–10.5)
nRBC: 0 % (ref 0.0–0.2)

## 2023-03-24 LAB — BASIC METABOLIC PANEL
Anion gap: 8 (ref 5–15)
BUN: 15 mg/dL (ref 8–23)
CO2: 26 mmol/L (ref 22–32)
Calcium: 8.9 mg/dL (ref 8.9–10.3)
Chloride: 100 mmol/L (ref 98–111)
Creatinine, Ser: 0.75 mg/dL (ref 0.44–1.00)
GFR, Estimated: >60 mL/min (ref >60)
Glucose, Bld: 97 mg/dL (ref 70–99)
Potassium: 4 mmol/L (ref 3.5–5.1)
Sodium: 134 mmol/L — ABNORMAL LOW (ref 135–145)

## 2023-03-24 LAB — GROUP A STREP BY PCR: Group A Strep by PCR: NOT DETECTED

## 2023-03-24 MED ORDER — IOHEXOL 300 MG/ML  SOLN
75.0000 mL | Freq: Once | INTRAMUSCULAR | Status: AC | PRN
  Administered 2023-03-24: 75 mL via INTRAVENOUS

## 2023-03-24 MED ORDER — DEXAMETHASONE 10 MG/ML FOR PEDIATRIC ORAL USE
10.0000 mg | Freq: Once | INTRAMUSCULAR | Status: AC
  Administered 2023-03-24: 10 mg via ORAL
  Filled 2023-03-24: qty 1

## 2023-03-24 NOTE — Telephone Encounter (Signed)
Placed referral as urgent and faxed it to [facility] at [phone]. Patient is currently in the ER. Called patient and left a detail message

## 2023-03-24 NOTE — ED Notes (Signed)
ER MD at bedside

## 2023-03-24 NOTE — ED Provider Notes (Signed)
[facility] Provider Note    Event Date/Time   First MD Initiated Contact with Patient 03/24/23 1335     (approximate)   History   Chief Complaint Tonsil Swelling   HPI  Christina Carter is a 62 y.o. female with past medical history of hypertension who presents to the ED complaining of tonsil swelling.  Patient reports that she had an endoscopy performed 2 weeks ago for esophageal stricture, but since this procedure has had difficulty swallowing at times.  She states she is able to tolerate liquids without difficulty but has more trouble with solids.  She does state that she has been feeling slightly short of breath over the past couple of days.  She went to look in her throat earlier today and saw an area of swelling around her right tonsil, initially tried to see her GI provider but was referred to the ED for further evaluation.  She denies any sore throat and has not had any fevers.     Physical Exam   Triage Vital Signs: ED Triage Vitals [03/24/23 1313]  Encounter Vitals Group     BP (!) 160/70     Systolic BP Percentile      Diastolic BP Percentile      Pulse Rate 84     Resp 16     Temp 98 F (36.7 C)     Temp Source Oral     SpO2 98 %     Weight 204 lb 9.4 oz (92.8 kg)     Height 5\' 4"  (1.626 m)     Head Circumference      Peak Flow      Pain Score 0     Pain Loc      Pain Education      Exclude from Growth Chart     Most recent vital signs: Vitals:   03/24/23 1313  BP: (!) 160/70  Pulse: 84  Resp: 16  Temp: 98 F (36.7 C)  SpO2: 98%    Constitutional: Alert and oriented. Eyes: Conjunctivae are normal. Head: Atraumatic. Nose: No congestion/rhinnorhea. Mouth/Throat: Mucous membranes are moist.  Edema noted to the area of right tonsil with mild erythema but no exudates.  No uvular deviation noted. Cardiovascular: Normal rate, regular rhythm. Grossly normal heart sounds.  2+ radial pulses bilaterally. Respiratory: Normal  respiratory effort.  No retractions. Lungs CTAB. Gastrointestinal: Soft and nontender. No distention. Musculoskeletal: No lower extremity tenderness nor edema.  Neurologic:  Normal speech and language. No gross focal neurologic deficits are appreciated.    ED Results / Procedures / Treatments   Labs (all labs ordered are listed, but only abnormal results are displayed) Labs Reviewed  BASIC METABOLIC PANEL - Abnormal; Notable for the following components:      Result Value   Sodium 134 (*)    All other components within normal limits  GROUP A STREP BY PCR  CBC WITH DIFFERENTIAL/PLATELET    PROCEDURES:  Critical Care performed: No  Procedures   MEDICATIONS ORDERED IN ED: Medications  dexamethasone (DECADRON) 10 MG/ML injection for Pediatric ORAL use 10 mg (10 mg Oral Given 03/24/23 1437)  iohexol (OMNIPAQUE) 300 MG/ML solution 75 mL (75 mLs Intravenous Contrast Given 03/24/23 1448)     IMPRESSION / MDM / ASSESSMENT AND PLAN / ED COURSE  I reviewed the triage vital signs and the nursing notes.  62 y.o. female with past medical history of hypertension who presents to the ED with difficulty swallowing and mild difficulty breathing with throat swelling for the past 2 weeks.  Patient's presentation is most consistent with acute presentation with potential threat to life or bodily function.  Differential diagnosis includes, but is not limited to, tonsillitis, peritonsillar abscess, strep pharyngitis, malignancy.  Patient nontoxic-appearing and in no acute distress, vital signs are unremarkable.  She is breathing comfortably on room air, no stridor or wheezing noted.  She does have swelling around her right tonsil, seems less consistent with peritonsillar abscess or other infectious process, appears more like fatty tissue but would also be concern for malignancy.  We will further assess with CT imaging, labs are pending at this time.  We will treat with  Decadron and also test for strep.  Patient turned over to on, provider pending CT results.  Labs with no significant anemia, leukocytosis, tract abnormality, or AKI.  Strep testing is negative, patient given dose of Decadron for swelling.      FINAL CLINICAL IMPRESSION(S) / ED DIAGNOSES   Final diagnoses:  Throat swelling     Rx / DC Orders   ED Discharge Orders     None        Note:  This document was prepared using Dragon voice recognition software and may include unintentional dictation errors.   Chesley Noon, MD 03/24/23 [phone]

## 2023-03-24 NOTE — Telephone Encounter (Signed)
Patient states she had a EGD 03/10/2023 and now she has a bump on her Uvula. She states it is swollen and has white bumps around it. She states she is having dysphagia from it because it is a size of a quarter. Please advise what you recommend. She states she does not have a sore throat or any other symptoms

## 2023-03-24 NOTE — Telephone Encounter (Signed)
Patient called in because it's hard for her to swallow and there are some other problems she would like to discuss with the nurse.

## 2023-03-24 NOTE — Telephone Encounter (Signed)
Patient stopped by our office wanting someone to look at it. Informed her the providers were in clinic and no one could just look at her throat with out a appointment. She states that she feels like she is having trouble breathing because of it and is scared. Informed her if she feels like that she needs to go to the ER now. She states she will go but dont know if she will stay if it is a long wait

## 2023-03-24 NOTE — Discharge Instructions (Addendum)
Please seek medical attention for any high fevers, chest pain, shortness of breath, change in behavior, persistent vomiting, bloody stool or any other new or concerning symptoms.  

## 2023-03-24 NOTE — ED Triage Notes (Addendum)
Pt to ed from home via POV for swelling to the back of her throat. Pt had upper GI procedure for esophageal widening 9/26 and 6 weeks prior to that. Pt has large swelling to the right anatomical tonsil. Pt advised it is hard to swallow things now. Pt advised it has been swollen for the last 1.5 weeks.  Pt is moving good air and no obvious shortness of breath. Pt is CAOx4, in no acute distress and ambulatory in triage. This RN assessed her tonsil, its swollen and there appears to be a small pocket of puss on it. Pt "stopped by her MD office" to have them look at it and they wouldn't look at it so they sent her here.

## 2023-03-24 NOTE — ED Provider Notes (Signed)
CT soft tissue IMPRESSION:  Asymmetric enlargement of the right palatine tonsils, possibly  consistent with tonsillitis. No evidence of peritonsillar abscess.  Clinical follow-up to resolution is recommended.    Discussed finding with patient.  She does states she is feeling better after receiving the steroids earlier.  I did discuss with patient portance of following up with primary care to assure resolution.  The patient continued to have symptoms they would likely refer her to ENT.   Phineas Semen, MD 03/24/23 [phone]

## 2023-04-04 ENCOUNTER — Other Ambulatory Visit: Payer: Self-pay | Admitting: Unknown Physician Specialty

## 2023-04-04 DIAGNOSIS — D3709 Neoplasm of uncertain behavior of other specified sites of the oral cavity: Secondary | ICD-10-CM | POA: Diagnosis not present

## 2023-04-05 ENCOUNTER — Encounter: Payer: Self-pay | Admitting: Unknown Physician Specialty

## 2023-04-05 ENCOUNTER — Other Ambulatory Visit: Payer: Self-pay

## 2023-04-11 NOTE — Progress Notes (Incomplete)
NEUROLOGY FOLLOW UP OFFICE NOTE  Christina Carter [id number]  Assessment/Plan:    Assessment/Plan:   Chronic tension type headache History of subdural hematoma  1  Start zonisamide 25mg  daily for one week, then increase to 50mg  daily 2  Limit use of pain relievers to no more than 2 days out of week to prevent risk of rebound or medication-overuse headache. 3  Keep headache diary 4  Follow up 4-5 months.   Subjective:  Christina Carter is a 62 year old right-handed female with OA, hypercholesterolemia, mild intermittent asthma and HTN who follows up for ***  UPDATE: ***  Current NSAIDS/analgesics:  Advil Current triptans:  none Current ergotamine:  none Current anti-emetic:  none Current muscle relaxants:  none Current Antihypertensive medications:  amlodipine 10mg  daily Current Antidepressant medications:  none Current Anticonvulsant medications:  none Current anti-CGRP:  none Current Vitamins/Herbal/Supplements:  Centrum silver, C Current Antihistamines/Decongestants:  none  HISTORY: On 05/12/2017, she was admitted to [facility] for right subdural hematoma presenting as  several months history of right sided headache and altered vision that progressively got worse.  CT head showed acute and chronic large right subdural hematoma with 12 mm right to left shift.  CTV/CTA of head was negative for venous sinus thrombosis, aneurysm or other vascular abnormalities.  Underwent craniotomy and burr hole procedure for evacuation.  Headaches subsequently resolved.  In 2022, she began experiencing intermittent dizziness and recurrent episodes of syncope.  At that same time, she began experiencing frequent headaches.  Mostly right sided but would travel to involve various locations on her head, a mild pressure sensation.  No nausea, vomiting, photophobia, phonophobia, visual disturbance, speech disturbance, dizziness, numbness or weakness.  , CT head on 03/08/2021 personally reviewed revealed mild  chronic small vessel ischemic changes in the subcortical and periventricular white matter but no acute findings.  She saw neurology in December 2022 and was started on nortriptyline but she discontinued it due to side effects.  She continues to have a persistent daily headache.  She does not take any pain relievers, maybe an Advil every once in awhile.    Past NSAIDS/analgesics:  Tylenol Past abortive triptans:  none Past abortive ergotamine:  none Past muscle relaxants:  none Past anti-emetic:  Zofran Past antihypertensive medications:  none Past antidepressant medications:  nortriptyline 20mg  (side effects) Past anticonvulsant medications:  Keppra (briefly as prophylaxis following SDH and surgery) Past anti-CGRP:  none Past vitamins/Herbal/Supplements:  none     PAST MEDICAL HISTORY: Past Medical History:  Diagnosis Date   Asthma    albuterol inhaler   Brain aneurysm 05/14/2017   emergency surgery   GERD (gastroesophageal reflux disease)    occasionally   Headache    2-3 times a week   Heart murmur    told that once, no issue   Hx of narcotic allergy    makes her very nauseated, does not take   Hypertension    Numbness of toes    right   Osteoarthritis    2 knee replacements   Throat mass     MEDICATIONS: Current Outpatient Medications on File Prior to Visit  Medication Sig Dispense Refill   albuterol (VENTOLIN HFA) 108 (90 Base) MCG/ACT inhaler Inhale 2 puffs into the lungs every 6 (six) hours as needed for wheezing or shortness of breath.     amLODipine (NORVASC) 5 MG tablet Take 1 tablet (5 mg total) by mouth daily for 7 days. (Patient taking differently: Take 2.5 mg by mouth daily. Varies  from 2.5 to 5 mg) 7 tablet 0   Ascorbic Acid (VITAMIN C) 100 MG tablet Take 250 mg by mouth daily. gummies     ibuprofen (ADVIL) 100 MG tablet Take 400 mg by mouth every 6 (six) hours as needed for fever.     lisinopril-hydrochlorothiazide (ZESTORETIC) 20-25 MG tablet Take 1  tablet by mouth daily. (Patient not taking: Reported on 04/05/2023)     Multiple Vitamins-Minerals (MULTIVITAMIN WITH MINERALS) tablet Take 1 tablet by mouth daily. Centrum silver     phentermine 37.5 MG capsule Take 37.5 mg by mouth every morning. Taking 1/2 per day     No current facility-administered medications on file prior to visit.    ALLERGIES: Allergies  Allergen Reactions   Codeine Nausea And Vomiting    FAMILY HISTORY: No family history on file.    Objective:  *** General: No acute distress.  Patient appears ***-groomed.   Head:  Normocephalic/atraumatic Eyes:  Fundi examined but not visualized Neck: supple, no paraspinal tenderness, full range of motion Heart:  Regular rate and rhythm Lungs:  Clear to auscultation bilaterally Back: No paraspinal tenderness Neurological Exam: alert and oriented.  Speech fluent and not dysarthric, language intact.  CN II-XII intact. Bulk and tone normal, muscle strength 5/5 throughout.  Sensation to light touch intact.  Deep tendon reflexes 2+ throughout, toes downgoing.  Finger to nose testing intact.  Gait normal, Romberg negative.   Shon Millet, DO  CC: ***

## 2023-04-11 NOTE — Discharge Instructions (Signed)
T & A INSTRUCTION SHEET - [facility]  CHAPMAN MCQUEEN, MD    INFORMATION SHEET FOR A TONSILLECTOMY AND ADENDOIDECTOMY  About Your Tonsils and Adenoids  The tonsils and adenoids are normal body tissues that are part of our immune system.  They normally help to protect us against diseases that may enter our mouth and nose. However, sometimes the tonsils and/or adenoids become too large and obstruct our breathing, especially at night.    If either of these things happen it helps to remove the tonsils and adenoids in order to become healthier. The operation to remove the tonsils and adenoids is called a tonsillectomy and adenoidectomy.  The Location of Your Tonsils and Adenoids  The tonsils are located in the back of the throat on both side and sit in a cradle of muscles. The adenoids are located in the roof of the mouth, behind the nose, and closely associated with the opening of the Eustachian tube to the ear.  Surgery on Tonsils and Adenoids  A tonsillectomy and adenoidectomy is a short operation which takes about thirty minutes.  This includes being put to sleep and being awakened. Tonsillectomies and adenoidectomies are performed at [facility] and may require observation period in the recovery room prior to going home. Children are required to remain in recovery for at least 45 minutes.   Following the Operation for a Tonsillectomy  A cautery machine is used to control bleeding. Bleeding from a tonsillectomy and adenoidectomy is minimal and postoperatively the risk of bleeding is approximately four percent, although this rarely life threatening.  After your tonsillectomy and adenoidectomy post-op care at home: 1. Our patients are able to go home the same day. You may be given prescriptions for pain medications, if indicated. 2. It is extremely important to remember that fluid intake is of utmost importance after a tonsillectomy. The  amount that you drink must be maintained in the postoperative period. A good indication of whether a child is getting enough fluid is whether his/her urine output is constant. As long as children are urinating or wetting their diaper every 6 - 8 hours this is usually enough fluid intake.   3. Although rare, this is a risk of some bleeding in the first ten days after surgery. This usually occurs between day five and nine postoperatively. This risk of bleeding is approximately four percent. If you or your child should have any bleeding you should remain calm and notify our office or go directly to the emergency room at [facility] where they will contact us. Our doctors are available seven days a week for notification. We recommend sitting up quietly in a chair, place an ice pack on the front of the neck and spitting out the blood gently until we are able to contact you. Adults should gargle gently with ice water and this may help stop the bleeding. If the bleeding does not stop after a short time, i.e. 10 to 15 minutes, or seems to be increasing again, please contact us or go to the hospital.   4. It is common for the pain to be worse at 5 - 7 days postoperatively. This occurs because the "scab" is peeling off and the mucous membrane (skin of the throat) is growing back where the tonsils were.   5. It is common for a low-grade fever, less than 102, during the first week after a tonsillectomy and adenoidectomy. It is usually due to   not drinking enough liquids, and we suggest your use liquid Tylenol (acetaminophen) or the pain medicine with Tylenol (acetaminophen) prescribed in order to keep your temperature below 102. Please follow the directions on the back of the bottle. 6. Recommendations for post-operative pain in children and adults: a) For Children 12 and younger: Recommendations are for oral Tylenol (acetaminophen) and oral Motrin (Ibuprofen). Administer the Tylenol (acetaminophen) and  Motrin (ibuprofen) as stated on bottle for patient's age/weight. Sometimes it may be necessary to alternate the Tylenol (acetaminophen) and Motrin for improved pain control. Motrin does last slightly longer so many patients benefit from being given this prior to bedtime. All children should avoid Aspirin products for 2 weeks following surgery. b) For children over the age of 12: Tylenol (acetaminophen) is the preferred first choice for pain control. Depending on your child's size, sometimes they will be given a combination of Tylenol (acetaminophen) and hydrocodone medication or sometimes it will be recommended they take Motrin (ibuprofen) in addition to the Tylenol (acetaminophen). Narcotics should always be used with caution in children following surgery as they can suppress their breathing and switching to over the counter Tylenol (acetaminophen) and Motrin (ibuprofen) as soon as possible is recommended. All patients should avoid Aspirin products for 2 weeks following surgery. c) Adults: Usually adults will require a narcotic pain medication following a tonsillectomy. This usually has either hydrocodone or oxycodone in it and can usually be taken every 4 to 6 hours as needed for moderate pain. If the medication does not have Tylenol (acetaminophen) in it, you may also supplement Tylenol (acetaminophen) as needed every 4 to 6 hours for breakthrough or mild pain. Adults should avoid Aspirin, Aleve, Motrin, and Ibuprofen products for 2 weeks following surgery as they can increase your risk of bleeding. 7. If you happen to look in the mirror or into your child's mouth you will see white/gray patches on the back of the throat. This is what a scab looks like in the mouth and is normal after having a tonsillectomy and adenoidectomy. They will disappear once the tonsil areas heal completely. However, it may cause a noticeable odor, and this too will disappear with time.     8. You or your child may experience ear  pain after having a tonsillectomy and adenoidectomy.  This is called referred pain and comes from the throat, but it is felt in the ears.  Ear pain is quite common and expected. It will usually go away after ten days. There is usually nothing wrong with the ears, and it is primarily due to the healing area stimulating the nerve to the ear that runs along the side of the throat. Use either the prescribed pain medicine or Tylenol (acetaminophen) as needed.  9. The throat tissues after a tonsillectomy are obviously sensitive. Smoking around children who have had a tonsillectomy significantly increases the risk of bleeding. DO NOT SMOKE!  

## 2023-04-12 ENCOUNTER — Encounter: Payer: Self-pay | Admitting: Neurology

## 2023-04-12 ENCOUNTER — Ambulatory Visit (INDEPENDENT_AMBULATORY_CARE_PROVIDER_SITE_OTHER): Payer: Medicare HMO | Admitting: Neurology

## 2023-04-12 VITALS — BP 142/83 | HR 79 | Ht 63.0 in | Wt 201.0 lb

## 2023-04-12 DIAGNOSIS — Z8679 Personal history of other diseases of the circulatory system: Secondary | ICD-10-CM | POA: Diagnosis not present

## 2023-04-12 DIAGNOSIS — M5481 Occipital neuralgia: Secondary | ICD-10-CM

## 2023-04-12 DIAGNOSIS — G44229 Chronic tension-type headache, not intractable: Secondary | ICD-10-CM

## 2023-04-12 MED ORDER — DULOXETINE HCL 20 MG PO CPEP: 20.0000 mg | ORAL_CAPSULE | Freq: Every day | ORAL | 5 refills | Status: AC

## 2023-04-12 NOTE — Patient Instructions (Signed)
Start duloxetine 20mg  daily.  If no improvement in headaches in 4 weeks (prior to refill), contact me and I will increase dose. From my standpoint, you are cleared for surgery.  Will send form to Dr. Jenne Campus Follow up 5 months

## 2023-04-12 NOTE — Progress Notes (Signed)
NEUROLOGY FOLLOW UP OFFICE NOTE  Christina Carter [id number]  Assessment/Plan:   Chronic tension type headache History of subdural hematoma  1  Start duloxetine 20mg  daily.  Due to concerns about side effects, will start low dose.  We can increase to 30mg  daily in 4 weeks if needed. 2  Limit use of pain relievers to no more than 2 days out of week to prevent risk of rebound or medication-overuse headache. 3  Keep headache diary 4  Follow up 4-5 months.  From my standpoint, she is neurologically cleared for tonsillectomy   Subjective:  Christina Carter is a 62 year old right-handed female with OA, hypercholesterolemia, mild intermittent asthma and HTN who follows up for headaches and need for neurological clearance for tonsillectomy  UPDATE: Last seen in November 2023.  Tried zonisamide but didn't like it. Headaches are daily.  Usually mild to moderate.  May last a few days but sometimes persistent for 2-3 days.  One time she had a stabbing pain in the back of her head.  Otherwise, she continues to have the pressure headache.  Treats with Advil only once a week.  Doesn't like taking medications.    Scheduled for tonsillectomy for a cyst but needs clearance.    Current NSAIDS/analgesics:  Advil Current triptans:  none Current ergotamine:  none Current anti-emetic:  none Current muscle relaxants:  none Current Antihypertensive medications:  amlodipine 10mg  daily Current Antidepressant medications:  none Current Anticonvulsant medications:  none Current anti-CGRP:  none Current Vitamins/Herbal/Supplements:  Centrum silver, C Current Antihistamines/Decongestants:  none  HISTORY: On 05/12/2017, she was admitted to [facility] for right subdural hematoma presenting as  several months history of right sided headache and altered vision that progressively got worse.  CT head showed acute and chronic large right subdural hematoma with 12 mm right to left shift.  CTV/CTA of head was negative for  venous sinus thrombosis, aneurysm or other vascular abnormalities.  Underwent craniotomy and burr hole procedure for evacuation.  Headaches subsequently resolved.  In 2022, she began experiencing intermittent dizziness and recurrent episodes of syncope.  At that same time, she began experiencing frequent headaches.  Mostly right sided but would travel to involve various locations on her head, a mild pressure sensation.  No nausea, vomiting, photophobia, phonophobia, visual disturbance, speech disturbance, dizziness, numbness or weakness.  , CT head on 03/08/2021 personally reviewed revealed mild chronic small vessel ischemic changes in the subcortical and periventricular white matter but no acute findings.  She saw neurology in December 2022 and was started on nortriptyline but she discontinued it due to side effects.  She continues to have a persistent daily headache.  She does not take any pain relievers, maybe an Advil every once in awhile.    Past NSAIDS/analgesics:  Tylenol Past abortive triptans:  none Past abortive ergotamine:  none Past muscle relaxants:  none Past anti-emetic:  Zofran Past antihypertensive medications:  none Past antidepressant medications:  nortriptyline 20mg  (side effects) Past anticonvulsant medications:  Keppra (briefly as prophylaxis following SDH and surgery) Past anti-CGRP:  none Past vitamins/Herbal/Supplements:  none  PAST MEDICAL HISTORY: Past Medical History:  Diagnosis Date   Asthma    albuterol inhaler   Brain aneurysm 05/14/2017   emergency surgery   GERD (gastroesophageal reflux disease)    occasionally   Headache    2-3 times a week   Heart murmur    told that once, no issue   Hx of narcotic allergy    makes her very nauseated,  does not take   Hypertension    Numbness of toes    right   Osteoarthritis    2 knee replacements   Throat mass     MEDICATIONS: Current Outpatient Medications on File Prior to Visit  Medication Sig Dispense Refill    albuterol (VENTOLIN HFA) 108 (90 Base) MCG/ACT inhaler Inhale 2 puffs into the lungs every 6 (six) hours as needed for wheezing or shortness of breath.     amLODipine (NORVASC) 5 MG tablet Take 1 tablet (5 mg total) by mouth daily for 7 days. (Patient taking differently: Take 2.5 mg by mouth daily. Varies from 2.5 to 5 mg) 7 tablet 0   Ascorbic Acid (VITAMIN C) 100 MG tablet Take 250 mg by mouth daily. gummies     ibuprofen (ADVIL) 100 MG tablet Take 400 mg by mouth every 6 (six) hours as needed for fever.     lisinopril-hydrochlorothiazide (ZESTORETIC) 20-25 MG tablet Take 1 tablet by mouth daily. (Patient not taking: Reported on 04/05/2023)     Multiple Vitamins-Minerals (MULTIVITAMIN WITH MINERALS) tablet Take 1 tablet by mouth daily. Centrum silver     phentermine 37.5 MG capsule Take 37.5 mg by mouth every morning. Taking 1/2 per day     No current facility-administered medications on file prior to visit.    ALLERGIES: Allergies  Allergen Reactions   Codeine Nausea And Vomiting    FAMILY HISTORY: No family history on file.    Objective:  Blood pressure (!) 142/83, pulse 79, height 5\' 3"  (1.6 m), weight 201 lb (91.2 kg), SpO2 98%. General: No acute distress.  Patient appears well-groomed.   Head:  Normocephalic/atraumatic Eyes:  Fundi examined but not visualized Neck: supple, no paraspinal tenderness, full range of motion Heart:  Regular rate and rhythm Neurological Exam: alert and oriented.  Speech fluent and not dysarthric, language intact.  CN II-XII intact. Bulk and tone normal, muscle strength 5/5 throughout.  Sensation to light touch intact.  Deep tendon reflexes 2+ throughout, toes downgoing.  Finger to nose testing intact.  Gait normal, Romberg negative.   Shon Millet, DO  CC: Christina Peak, PA-C

## 2023-04-13 ENCOUNTER — Telehealth: Payer: Self-pay | Admitting: Neurology

## 2023-04-13 NOTE — Telephone Encounter (Signed)
Samantha from [facility] called in regards of seeing if pt was cleared for surgery

## 2023-04-13 NOTE — Telephone Encounter (Signed)
Clearance faxed back to [facility].

## 2023-04-15 ENCOUNTER — Encounter
Admission: RE | Disposition: A | Discharge status: Home / Self Care | Payer: Self-pay | Source: Home / Self Care | Attending: Unknown Physician Specialty

## 2023-04-15 ENCOUNTER — Ambulatory Visit: Payer: Medicare HMO | Admitting: Anesthesiology

## 2023-04-15 ENCOUNTER — Ambulatory Visit
Admission: RE | Admit: 2023-04-15 | Discharge: 2023-04-15 | Disposition: A | Discharge status: Home / Self Care | Payer: Medicare HMO | Attending: Unknown Physician Specialty | Admitting: Unknown Physician Specialty

## 2023-04-15 ENCOUNTER — Other Ambulatory Visit: Payer: Self-pay

## 2023-04-15 ENCOUNTER — Encounter: Payer: Self-pay | Admitting: Unknown Physician Specialty

## 2023-04-15 DIAGNOSIS — J351 Hypertrophy of tonsils: Secondary | ICD-10-CM | POA: Diagnosis not present

## 2023-04-15 DIAGNOSIS — Z87891 Personal history of nicotine dependence: Secondary | ICD-10-CM | POA: Diagnosis not present

## 2023-04-15 DIAGNOSIS — J3501 Chronic tonsillitis: Secondary | ICD-10-CM | POA: Insufficient documentation

## 2023-04-15 DIAGNOSIS — I10 Essential (primary) hypertension: Secondary | ICD-10-CM | POA: Insufficient documentation

## 2023-04-15 DIAGNOSIS — J358 Other chronic diseases of tonsils and adenoids: Secondary | ICD-10-CM | POA: Diagnosis not present

## 2023-04-15 DIAGNOSIS — K098 Other cysts of oral region, not elsewhere classified: Secondary | ICD-10-CM | POA: Diagnosis present

## 2023-04-15 DIAGNOSIS — D3709 Neoplasm of uncertain behavior of other specified sites of the oral cavity: Secondary | ICD-10-CM | POA: Diagnosis not present

## 2023-04-15 HISTORY — DX: Anesthesia of skin: R20.0

## 2023-04-15 HISTORY — DX: Allergy status to narcotic agent: Z88.5

## 2023-04-15 HISTORY — PX: TONSILLECTOMY: SHX5217

## 2023-04-15 HISTORY — DX: Gastro-esophageal reflux disease without esophagitis: K21.9

## 2023-04-15 HISTORY — DX: Unspecified asthma, uncomplicated: J45.909

## 2023-04-15 HISTORY — DX: Headache, unspecified: R51.9

## 2023-04-15 HISTORY — DX: Other diseases of pharynx: J39.2

## 2023-04-15 HISTORY — DX: Cardiac murmur, unspecified: R01.1

## 2023-04-15 SURGERY — TONSILLECTOMY
Anesthesia: General | Laterality: Bilateral

## 2023-04-15 MED ORDER — SODIUM CHLORIDE 0.9% FLUSH: 10.0000 mL | Freq: Two times a day (BID) | INTRAVENOUS | Status: DC

## 2023-04-15 MED ORDER — PROPOFOL 10 MG/ML IV BOLUS
INTRAVENOUS | Status: DC | PRN
  Administered 2023-04-15: 150 mg via INTRAVENOUS
  Administered 2023-04-15: 50 mg via INTRAVENOUS

## 2023-04-15 MED ORDER — KETOROLAC TROMETHAMINE 30 MG/ML IJ SOLN
INTRAMUSCULAR | Status: AC
Start: 2023-04-15 — End: ?
  Filled 2023-04-15: qty 1

## 2023-04-15 MED ORDER — OXYCODONE HCL 5 MG/5ML PO SOLN: 5.0000 mg | Freq: Once | ORAL | Status: DC | PRN

## 2023-04-15 MED ORDER — ROCURONIUM BROMIDE 10 MG/ML (PF) SYRINGE
PREFILLED_SYRINGE | INTRAVENOUS | Status: AC
  Filled 2023-04-15: qty 10

## 2023-04-15 MED ORDER — FENTANYL CITRATE PF 50 MCG/ML IJ SOSY
25.0000 ug | PREFILLED_SYRINGE | INTRAMUSCULAR | Status: DC | PRN
Start: 2023-04-15 — End: 2023-04-15

## 2023-04-15 MED ORDER — KETOROLAC TROMETHAMINE 15 MG/ML IJ SOLN
INTRAMUSCULAR | Status: DC | PRN
Start: 2023-04-15 — End: 2023-04-15
  Administered 2023-04-15: 15 mg via INTRAVENOUS

## 2023-04-15 MED ORDER — DEXAMETHASONE SODIUM PHOSPHATE 4 MG/ML IJ SOLN
INTRAMUSCULAR | Status: AC
Start: 2023-04-15 — End: ?
  Filled 2023-04-15: qty 2

## 2023-04-15 MED ORDER — HYDROCODONE-ACETAMINOPHEN 7.5-325 MG/15ML PO SOLN: 10.0000 mL | Freq: Four times a day (QID) | ORAL | 0 refills | Status: AC | PRN

## 2023-04-15 MED ORDER — DEXAMETHASONE SODIUM PHOSPHATE 4 MG/ML IJ SOLN
INTRAMUSCULAR | Status: DC | PRN
  Administered 2023-04-15: 8 mg via INTRAVENOUS

## 2023-04-15 MED ORDER — MIDAZOLAM HCL 2 MG/2ML IJ SOLN
INTRAMUSCULAR | Status: AC
  Filled 2023-04-15: qty 2

## 2023-04-15 MED ORDER — ACETAMINOPHEN 10 MG/ML IV SOLN
INTRAVENOUS | Status: DC | PRN
  Administered 2023-04-15: 1000 mg via INTRAVENOUS

## 2023-04-15 MED ORDER — LIDOCAINE HCL (CARDIAC) PF 100 MG/5ML IV SOSY
PREFILLED_SYRINGE | INTRAVENOUS | Status: DC | PRN
  Administered 2023-04-15: 50 mg via INTRAVENOUS

## 2023-04-15 MED ORDER — FENTANYL CITRATE (PF) 100 MCG/2ML IJ SOLN
INTRAMUSCULAR | Status: AC
  Filled 2023-04-15: qty 2

## 2023-04-15 MED ORDER — SUCCINYLCHOLINE CHLORIDE 200 MG/10ML IV SOSY
PREFILLED_SYRINGE | INTRAVENOUS | Status: DC | PRN
  Administered 2023-04-15: 100 mg via INTRAVENOUS

## 2023-04-15 MED ORDER — LIDOCAINE HCL (PF) 2 % IJ SOLN
INTRAMUSCULAR | Status: AC
Start: 2023-04-15 — End: ?
  Filled 2023-04-15: qty 5

## 2023-04-15 MED ORDER — ONDANSETRON HCL 4 MG/2ML IJ SOLN
INTRAMUSCULAR | Status: DC | PRN
  Administered 2023-04-15: 4 mg via INTRAVENOUS

## 2023-04-15 MED ORDER — LIDOCAINE VISCOUS HCL 2 % MT SOLN: 10.0000 mL | OROMUCOSAL | 5 refills | Status: AC | PRN

## 2023-04-15 MED ORDER — MIDAZOLAM HCL 5 MG/5ML IJ SOLN
INTRAMUSCULAR | Status: DC | PRN
  Administered 2023-04-15: 2 mg via INTRAVENOUS

## 2023-04-15 MED ORDER — FENTANYL CITRATE (PF) 100 MCG/2ML IJ SOLN
INTRAMUSCULAR | Status: DC | PRN
  Administered 2023-04-15 (×2): 50 ug via INTRAVENOUS

## 2023-04-15 MED ORDER — ONDANSETRON HCL 4 MG PO TABS: 4.0000 mg | ORAL_TABLET | Freq: Three times a day (TID) | ORAL | 0 refills | Status: AC | PRN

## 2023-04-15 MED ORDER — SEVOFLURANE IN SOLN
RESPIRATORY_TRACT | Status: AC
  Filled 2023-04-15: qty 250

## 2023-04-15 MED ORDER — ACETAMINOPHEN 10 MG/ML IV SOLN
INTRAVENOUS | Status: AC
  Filled 2023-04-15: qty 100

## 2023-04-15 MED ORDER — ONDANSETRON HCL 4 MG/2ML IJ SOLN
INTRAMUSCULAR | Status: AC
  Filled 2023-04-15: qty 2

## 2023-04-15 MED ORDER — BUPIVACAINE HCL (PF) 0.5 % IJ SOLN
INTRAMUSCULAR | Status: DC | PRN
  Administered 2023-04-15: 8 mL

## 2023-04-15 MED ORDER — OXYCODONE HCL 5 MG PO TABS: 5.0000 mg | ORAL_TABLET | Freq: Once | ORAL | Status: DC | PRN

## 2023-04-15 SURGICAL SUPPLY — 20 items
"PENCIL ELECTRO HAND CTR " (MISCELLANEOUS) ×1 IMPLANT
ANTIFOG SOL W/FOAM PAD STRL (MISCELLANEOUS) ×1
CANISTER SUCT 1200ML W/VALVE (MISCELLANEOUS) ×2 IMPLANT
CATH ROBINSON RED A/P 8FR (CATHETERS) ×2 IMPLANT
DRAPE HEAD BAR (DRAPES) ×2 IMPLANT
ELECT CAUTERY BLADE TIP 2.5 (TIP) ×1
ELECT REM PT RETURN 9FT ADLT (ELECTROSURGICAL) ×1
ELECTRODE CAUTERY BLDE TIP 2.5 (TIP) ×2 IMPLANT
ELECTRODE REM PT RTRN 9FT ADLT (ELECTROSURGICAL) ×2 IMPLANT
GLOVE SURG ENC TEXT LTX SZ7.5 (GLOVE) ×2 IMPLANT
KIT TURNOVER KIT A (KITS) ×2 IMPLANT
NS IRRIG 500ML POUR BTL (IV SOLUTION) ×2 IMPLANT
PACK TONSIL AND ADENOID CUSTOM (PACKS) ×2 IMPLANT
PENCIL ELECTRO HAND CTR (MISCELLANEOUS) ×2 IMPLANT
SOLUTION ANTFG W/FOAM PAD STRL (MISCELLANEOUS) ×2 IMPLANT
SPONGE TONSIL 1 RF SGL (DISPOSABLE) ×2 IMPLANT
STRAP BODY AND KNEE 60X3 (MISCELLANEOUS) ×2 IMPLANT
SUCTION COAG ELEC 10 HAND CTRL (ELECTROSURGICAL) IMPLANT
SYR 10ML LL (SYRINGE) ×2 IMPLANT
SYR 5ML LL (SYRINGE) ×2 IMPLANT

## 2023-04-15 NOTE — H&P (Signed)
The patient's history has been reviewed, patient examined, no change in status, stable for surgery.  Questions were answered to the patients satisfaction.  

## 2023-04-15 NOTE — Anesthesia Preprocedure Evaluation (Signed)
Anesthesia Evaluation  Patient identified by MRN, date of birth, ID band Patient awake    Reviewed: Allergy & Precautions, NPO status , Patient's Chart, lab work & pertinent test results  History of Anesthesia Complications (+) PONV and history of anesthetic complications  Airway Mallampati: III  TM Distance: >3 FB Neck ROM: full    Dental  (+) Teeth Intact   Pulmonary asthma , Patient abstained from smoking., former smoker   Pulmonary exam normal  + decreased breath sounds      Cardiovascular Exercise Tolerance: Good hypertension, Pt. on medications negative cardio ROS Normal cardiovascular exam Rhythm:Regular Rate:Normal     Neuro/Psych negative neurological ROS  negative psych ROS   GI/Hepatic negative GI ROS, Neg liver ROS,,,  Endo/Other  negative endocrine ROS    Renal/GU      Musculoskeletal   Abdominal   Peds  Hematology negative hematology ROS (+)   Anesthesia Other Findings Past Medical History: No date: Brain aneurysm No date: Hypertension No date: Osteoarthritis  Past Surgical History: No date: APPENDECTOMY 01/25/2023: BALLOON DILATION     Comment:  Procedure: BALLOON DILATION;  Surgeon: Midge Minium, MD;              Location: [facility] ENDOSCOPY;  Service: Endoscopy;; 01/25/2023: BIOPSY     Comment:  Procedure: BIOPSY;  Surgeon: Midge Minium, MD;                Location: [facility] ENDOSCOPY;  Service: Endoscopy;; No date: CHOLECYSTECTOMY No date: COLONOSCOPY 01/25/2023: ESOPHAGOGASTRODUODENOSCOPY (EGD) WITH PROPOFOL; N/A     Comment:  Procedure: ESOPHAGOGASTRODUODENOSCOPY (EGD) WITH               PROPOFOL;  Surgeon: Midge Minium, MD;  Location: [facility]               ENDOSCOPY;  Service: Endoscopy;  Laterality: N/A; No date: JOINT REPLACEMENT No date: REPLACEMENT TOTAL KNEE BILATERAL  BMI    Body Mass Index: 35.19 kg/m      Reproductive/Obstetrics negative OB ROS                              Anesthesia Physical Anesthesia Plan  ASA: 2  Anesthesia Plan: General ETT   Post-op Pain Management:    Induction: Intravenous  PONV Risk Score and Plan: Midazolam, Ondansetron and Dexamethasone  Airway Management Planned: Oral ETT  Additional Equipment:   Intra-op Plan:   Post-operative Plan: Extubation in OR  Informed Consent: I have reviewed the patients History and Physical, chart, labs and discussed the procedure including the risks, benefits and alternatives for the proposed anesthesia with the patient or authorized representative who has indicated his/her understanding and acceptance.     Dental Advisory Given  Plan Discussed with: Anesthesiologist, CRNA and Surgeon  Anesthesia Plan Comments: (Patient consented for risks of anesthesia including but not limited to:  - adverse reactions to medications - damage to eyes, teeth, lips or other oral mucosa - nerve damage due to positioning  - sore throat or hoarseness - Damage to heart, brain, nerves, lungs, other parts of body or loss of life  Patient voiced understanding and assent.)       Anesthesia Quick Evaluation

## 2023-04-15 NOTE — Transfer of Care (Signed)
Immediate Anesthesia Transfer of Care Note  Patient: Christina Carter  Procedure(s) Performed: TONSILLECTOMY (Bilateral)  Patient Location: PACU  Anesthesia Type: General ETT  Level of Consciousness: awake, alert  and patient cooperative  Airway and Oxygen Therapy: Patient Spontanous Breathing and Patient connected to supplemental oxygen  Post-op Assessment: Post-op Vital signs reviewed, Patient's Cardiovascular Status Stable, Respiratory Function Stable, Patent Airway and No signs of Nausea or vomiting  Post-op Vital Signs: Reviewed and stable  Complications: No notable events documented.

## 2023-04-15 NOTE — Anesthesia Postprocedure Evaluation (Signed)
Anesthesia Post Note  Patient: Christina Carter  Procedure(s) Performed: TONSILLECTOMY (Bilateral)  Patient location during evaluation: PACU Anesthesia Type: General Level of consciousness: awake and alert Pain management: pain level controlled Vital Signs Assessment: post-procedure vital signs reviewed and stable Respiratory status: spontaneous breathing, nonlabored ventilation, respiratory function stable and patient connected to nasal cannula oxygen Cardiovascular status: blood pressure returned to baseline and stable Postop Assessment: no apparent nausea or vomiting Anesthetic complications: no  No notable events documented.   Last Vitals:  Vitals:   04/15/23 1015 04/15/23 1020  BP: (!) 152/83   Pulse: 72 76  Resp: 15 (!) 23  Temp:    SpO2: 98% 98%    Last Pain:  Vitals:   04/15/23 1015  TempSrc:   PainSc: 0-No pain                 Stephanie Coup

## 2023-04-15 NOTE — Op Note (Signed)
PREOPERATIVE DIAGNOSIS:  Neoplasm uncertain behavior mouth  POSTOPERATIVE DIAGNOSIS:  Chronic Tonsillitis  OPERATION:  Tonsillectomy.  SURGEON:  Davina Poke, MD  ANESTHESIA:  General endotracheal.  OPERATIVE FINDINGS:  Large tonsils.  DESCRIPTION OF THE PROCEDURE: Christina Carter was identified in the holding area and taken to the operating room and placed in the supine position.  After general endotracheal anesthesia, the table was turned 45 degrees and the patient was draped in the usual fashion for a tonsillectomy.  A mouth gag was inserted into the oral cavity.  There were large tonsils. There was a cystic mass at the upper pole of the right tonsil.   Beginning on the left-hand side a tenaculum was used to grasp the tonsil and the Bovie cautery was used to dissect it free from the fossa.  In a similar fashion, the right tonsil was removed.  Meticulous hemostasis was achieved using the Bovie cautery.  With both tonsils removed and no active bleeding, 0.5% plain Marcaine was used to inject the anterior and posterior tonsillar pillars bilaterally.  A total of 8ml was used.  The patient tolerated the procedure well and was awakened in the operating room and taken to the recovery room in stable condition.   CULTURES:  None.  SPECIMENS:  Tonsils.  ESTIMATED BLOOD LOSS:  Less than 10 ml.  Davina Poke  04/15/2023  9:43 AM

## 2023-04-15 NOTE — Anesthesia Procedure Notes (Signed)
Procedure Name: Intubation Date/Time: 04/15/2023 9:20 AM  Performed by: Domenic Moras, CRNAPre-anesthesia Checklist: Patient identified, Emergency Drugs available, Suction available and Patient being monitored Patient Re-evaluated:Patient Re-evaluated prior to induction Oxygen Delivery Method: Circle system utilized Preoxygenation: Pre-oxygenation with 100% oxygen Induction Type: IV induction Ventilation: Mask ventilation without difficulty Laryngoscope Size: Mac and 3 Grade View: Grade I Tube type: Oral Rae Tube size: 7.0 mm Number of attempts: 1 Airway Equipment and Method: Stylet and Oral airway Placement Confirmation: ETT inserted through vocal cords under direct vision, positive ETCO2 and breath sounds checked- equal and bilateral Secured at: 20 cm Tube secured with: Tape Dental Injury: Teeth and Oropharynx as per pre-operative assessment

## 2023-04-16 ENCOUNTER — Encounter: Payer: Self-pay | Admitting: Unknown Physician Specialty

## 2023-04-18 LAB — SURGICAL PATHOLOGY

## 2023-05-31 DIAGNOSIS — J45909 Unspecified asthma, uncomplicated: Secondary | ICD-10-CM | POA: Diagnosis not present

## 2023-05-31 DIAGNOSIS — E7800 Pure hypercholesterolemia, unspecified: Secondary | ICD-10-CM | POA: Diagnosis not present

## 2023-05-31 DIAGNOSIS — I10 Essential (primary) hypertension: Secondary | ICD-10-CM | POA: Diagnosis not present

## 2023-09-12 ENCOUNTER — Ambulatory Visit: Payer: Medicare HMO | Admitting: Neurology

## 2023-12-06 DIAGNOSIS — Z6835 Body mass index (BMI) 35.0-35.9, adult: Secondary | ICD-10-CM | POA: Diagnosis not present

## 2023-12-06 DIAGNOSIS — I10 Essential (primary) hypertension: Secondary | ICD-10-CM | POA: Diagnosis not present

## 2023-12-06 DIAGNOSIS — E7800 Pure hypercholesterolemia, unspecified: Secondary | ICD-10-CM | POA: Diagnosis not present

## 2023-12-06 DIAGNOSIS — J45909 Unspecified asthma, uncomplicated: Secondary | ICD-10-CM | POA: Diagnosis not present

## 2023-12-06 DIAGNOSIS — M67432 Ganglion, left wrist: Secondary | ICD-10-CM | POA: Diagnosis not present

## 2023-12-19 DIAGNOSIS — M67432 Ganglion, left wrist: Secondary | ICD-10-CM | POA: Diagnosis not present

## 2024-01-17 ENCOUNTER — Encounter: Payer: Self-pay | Admitting: Dermatology

## 2024-01-17 ENCOUNTER — Ambulatory Visit: Admitting: Dermatology

## 2024-01-17 DIAGNOSIS — I671 Cerebral aneurysm, nonruptured: Secondary | ICD-10-CM | POA: Insufficient documentation

## 2024-01-17 DIAGNOSIS — Z96659 Presence of unspecified artificial knee joint: Secondary | ICD-10-CM | POA: Insufficient documentation

## 2024-01-17 DIAGNOSIS — F419 Anxiety disorder, unspecified: Secondary | ICD-10-CM | POA: Insufficient documentation

## 2024-01-17 DIAGNOSIS — F32A Depression, unspecified: Secondary | ICD-10-CM | POA: Insufficient documentation

## 2024-01-17 DIAGNOSIS — Z9049 Acquired absence of other specified parts of digestive tract: Secondary | ICD-10-CM | POA: Insufficient documentation

## 2024-01-17 DIAGNOSIS — I10 Essential (primary) hypertension: Secondary | ICD-10-CM | POA: Insufficient documentation

## 2024-01-17 DIAGNOSIS — L720 Epidermal cyst: Secondary | ICD-10-CM | POA: Diagnosis not present

## 2024-01-17 DIAGNOSIS — J45909 Unspecified asthma, uncomplicated: Secondary | ICD-10-CM | POA: Insufficient documentation

## 2024-01-17 NOTE — Patient Instructions (Addendum)

## 2024-01-17 NOTE — Progress Notes (Signed)
   New Patient Visit   Subjective  Christina Carter is a 63 y.o. female who presents for the following: cyst or ingrown hair at right inguinal fold and at left buttock. Gets irritated.   The following portions of the chart were reviewed this encounter and updated as appropriate: medications, allergies, medical history  Review of Systems:  No other skin or systemic complaints except as noted in HPI or Assessment and Plan.  Objective  Well appearing patient in no apparent distress; mood and affect are within normal limits.   A focused examination was performed of the following areas: Groin, buttocks  Relevant exam findings are noted in the Assessment and Plan.  Left Buttock, Right Inguinal Crease 1.0 cm Subcutaneous nodule.    Assessment & Plan      EPIDERMAL INCLUSION CYST (2) Left Buttock, Right Inguinal Crease Epidermal / dermal shaving - Right Inguinal Crease  Lesion diameter (cm):  1 Informed consent: discussed and consent obtained   Timeout: patient name, date of birth, surgical site, and procedure verified   Procedure prep:  Patient was prepped and draped in usual sterile fashion Prep type:  Isopropyl alcohol Anesthesia: the lesion was anesthetized in a standard fashion   Anesthetic:  1% lidocaine  w/ epinephrine 1-100,000 buffered w/ 8.4% NaHCO3 (6 cc) Instrument used: DermaBlade   Hemostasis achieved with: pressure and aluminum chloride   Outcome: patient tolerated procedure well   Post-procedure details: wound care instructions given    Specimen 1 - Surgical pathology Differential Diagnosis: Cyst  Check Margins: No  Return for cyst, Surgery, with Dr. Claudene.  LILLETTE Lonell Drones, RMA, am acting as scribe for Boneta Claudene, MD .   Documentation: I have reviewed the above documentation for accuracy and completeness, and I agree with the above.  Boneta Claudene, MD

## 2024-01-23 ENCOUNTER — Ambulatory Visit: Payer: Self-pay | Admitting: Dermatology

## 2024-01-23 LAB — SURGICAL PATHOLOGY

## 2024-01-24 NOTE — Telephone Encounter (Signed)
-----   Message from [place] sent at 01/23/2024  8:06 PM EDT ----- Diagnosis: right inguinal crease :       EPIDERMAL INCLUSION CYST   Plan: please call to share biopsy shows a benign skin cyst as expected. No further treatment needed ----- Message ----- From: Interface, Lab In [facility] Sent: 01/23/2024   4:19 PM EDT To: Boneta Sharps, MD

## 2024-02-07 NOTE — Telephone Encounter (Signed)
 Letter mailed to patient with pathology results.

## 2024-02-07 NOTE — Telephone Encounter (Signed)
-----   Message from [place] sent at 01/23/2024  8:06 PM EDT ----- Diagnosis: right inguinal crease :       EPIDERMAL INCLUSION CYST   Plan: please call to share biopsy shows a benign skin cyst as expected. No further treatment needed ----- Message ----- From: Interface, Lab In [facility] Sent: 01/23/2024   4:19 PM EDT To: Boneta Sharps, MD

## 2024-02-17 ENCOUNTER — Telehealth: Payer: Self-pay

## 2024-02-17 NOTE — Telephone Encounter (Signed)
 Pt called in to request an OV... Pt stated that she thinks she has a piece of chicken lodged in her esophagus for about 3 days... Pt had an EGD with dilation last year with Dr Jinny....   Pt was advised to go to ED to have this addressed urgently as she can experience some serious problems as a result.... Pt stated that she would go to [facility] but not sure when... I reiterated the urgent need for care immediately  Pt expressed understanding and stated she would go to ED

## 2024-03-27 DIAGNOSIS — Z1231 Encounter for screening mammogram for malignant neoplasm of breast: Secondary | ICD-10-CM | POA: Diagnosis not present
